# Patient Record
Sex: Female | Born: 1955 | Race: White | Hispanic: No | Marital: Married | Smoking: Never smoker
Health system: Southern US, Community
[De-identification: ages and names within clinical notes are randomized; demographics above are authoritative.]

## PROBLEM LIST (undated history)

## (undated) DIAGNOSIS — H269 Unspecified cataract: Secondary | ICD-10-CM

## (undated) DIAGNOSIS — Z794 Long term (current) use of insulin: Secondary | ICD-10-CM

## (undated) DIAGNOSIS — IMO0001 Reserved for inherently not codable concepts without codable children: Secondary | ICD-10-CM

## (undated) DIAGNOSIS — S52202K Unspecified fracture of shaft of left ulna, subsequent encounter for closed fracture with nonunion: Secondary | ICD-10-CM

## (undated) DIAGNOSIS — I10 Essential (primary) hypertension: Secondary | ICD-10-CM

## (undated) DIAGNOSIS — Z87442 Personal history of urinary calculi: Secondary | ICD-10-CM

## (undated) DIAGNOSIS — Z98811 Dental restoration status: Secondary | ICD-10-CM

## (undated) DIAGNOSIS — E7800 Pure hypercholesterolemia, unspecified: Secondary | ICD-10-CM

## (undated) DIAGNOSIS — E119 Type 2 diabetes mellitus without complications: Secondary | ICD-10-CM

## (undated) DIAGNOSIS — M1990 Unspecified osteoarthritis, unspecified site: Secondary | ICD-10-CM

## (undated) DIAGNOSIS — R112 Nausea with vomiting, unspecified: Secondary | ICD-10-CM

## (undated) DIAGNOSIS — Z9889 Other specified postprocedural states: Secondary | ICD-10-CM

## (undated) HISTORY — PX: CHOLECYSTECTOMY: SHX55

## (undated) HISTORY — PX: LUMBAR LAMINECTOMY: SHX95

## (undated) HISTORY — PX: ABDOMINAL HYSTERECTOMY: SHX81

## (undated) HISTORY — PX: TOTAL KNEE ARTHROPLASTY: SHX125

## (undated) HISTORY — PX: SHOULDER ARTHROSCOPY W/ ROTATOR CUFF REPAIR: SHX2400

## (undated) HISTORY — PX: TONSILLECTOMY: SUR1361

---

## 2012-04-22 ENCOUNTER — Other Ambulatory Visit: Payer: Self-pay | Admitting: Neurosurgery

## 2012-04-28 ENCOUNTER — Encounter (HOSPITAL_COMMUNITY)
Admission: RE | Admit: 2012-04-28 | Discharge: 2012-04-28 | Disposition: A | Discharge status: Home / Self Care | Payer: PRIVATE HEALTH INSURANCE | Source: Ambulatory Visit | Attending: Neurosurgery | Admitting: Neurosurgery

## 2012-04-28 ENCOUNTER — Encounter (HOSPITAL_COMMUNITY): Payer: Self-pay

## 2012-04-28 HISTORY — DX: Unspecified osteoarthritis, unspecified site: M19.90

## 2012-04-28 HISTORY — DX: Pure hypercholesterolemia, unspecified: E78.00

## 2012-04-28 HISTORY — DX: Essential (primary) hypertension: I10

## 2012-04-28 LAB — CBC
Hemoglobin: 14.8 g/dL (ref 12.0–15.0)
MCH: 30.8 pg (ref 26.0–34.0)
MCHC: 34.8 g/dL (ref 30.0–36.0)
RDW: 13.5 % (ref 11.5–15.5)

## 2012-04-28 LAB — BASIC METABOLIC PANEL
BUN: 18 mg/dL (ref 6–23)
Calcium: 10.1 mg/dL (ref 8.4–10.5)
Creatinine, Ser: 0.7 mg/dL (ref 0.50–1.10)
GFR calc non Af Amer: >90 mL/min (ref >90)
Glucose, Bld: 175 mg/dL — ABNORMAL HIGH (ref 70–99)
Potassium: 4.5 mEq/L (ref 3.5–5.1)

## 2012-04-28 MED ORDER — CEFAZOLIN SODIUM-DEXTROSE 2-3 GM-% IV SOLR
2.0000 g | INTRAVENOUS | Status: AC
  Administered 2012-04-29: 2 g via INTRAVENOUS
  Filled 2012-04-28: qty 50

## 2012-04-28 NOTE — H&P (Signed)
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  She has had previous lumbar diskectomy by Dr. Krista Blue which I believe was at this same level and did well from that.  She now says she has excruciating pain over the last five weeks with left leg pain and numbness to her left foot and toes and also weakness of the left foot.  She said the pain has gotten much worse over the last two weeks.  She says Dr. Orvan Falconer referred her to me for treatment.    She says this occurred 1  weeks after knee surgery on 02/05/2012.  The pain began and has increased weekly.  She is to follow with Dr. Chase Picket on 05/03/2012 for her knee.  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  She had right total knee replacement in 10/2006.  She had a laparoscopic cholecystectomy in the past.    REVIEW OF SYSTEMS:   A detailed Review of Systems sheet was reviewed with the patient.  Pertinent positives include: Eyes-wears glasses; Cardiovascular-high blood pressure, and high cholesterol; Musculoskeletal-back pain, joint pain or swelling, arthritis; Endocrine-diabetes.  All other systems are negative; this includes Constitutional symptoms, Ears, nose, mouth, throat, Respiratory, Gastrointestinal, Genitourinary, Integumentary & Breast, Neurologic, Psychiatric, Hematologic/Lymphatic, Allergic/Immunologic.    PAST MEDICAL HISTORY:      Current Medical Conditions:    Additional medical problems include hypertension, noninsulin dependent diabetes, and elevated cholesterol.       Prior Operations and Hospitalizations:   As previously described.      Medications and Allergies:   Mobic 15 mg. q.a.m. A dosepak which helped a little,  Flexeril 10 mg. q.d. which did not give her any relief, Percocet 10/325 up to 3 times daily.  She is also taking Januvia 100 mg. q.a.m., Estradiol 1 mg. q.a.m., Lisinopril 20 mg. q.a.m., Metformin HCL 500 mg. 3 with evening meal, and Atorvastatin 10 mg. q.p.m.        Height and Weight:     She is 5'4" tall, 215 pounds.  She denies tobacco, alcohol or drug use.     DIAGNOSTIC STUDIES:   MRI from [facility] performed 04/08/2012 shows a disc herniation at L4-5 with large left paracentral and left foraminal and left lateral disc protrusion, severe left foraminal narrowing with probable significant neural impingement.  There is low significant material continuing inferior in the left foraminal location raising the question of extruded disc material on the left.  There is mild right foraminal stenosis.  There is moderate to severe central stenosis at this level.  The remaining levels do not appear to be severely affected.    FAMILY HISTORY:    Both parents are deceased with history of diabetes, high blood pressure and heart disease.         PHYSICAL EXAMINATION:      General Appearance:   Phyllis Leonard is a pleasant and cooperative woman in no acute distress.  She is morbidly obese.      Blood Pressure, Pulse:     122/100, heart rate 80, respirations 18.    HEENT - normocephalic, atraumatic.  The pupils are equal, round and reactive to light.  The extraocular  muscles are intact.  Sclerae - white.  Conjunctiva - pink.  Oropharynx benign.  Uvula midline.     Neck - there are no masses, meningismus, deformities, tracheal deviation, jugular vein distention or carotid bruits.  There is normal cervical range of motion.  Spurlings' test is negative without reproducible radicular pain turning the patient's head to either side.  Lhermitte's sign is not present with axial compression.      Respiratory - there is normal respiratory effort with good intercostal function.  Lungs are clear to auscultation.  There are no  rales, rhonchi or wheezes.      Cardiovascular - the heart has regular rate and rhythm to auscultation.  No murmurs are appreciated.  There is no extremity edema, cyanosis or clubbing.  There are palpable pedal pulses.      Abdomen - soft, nontender, no hepatosplenomegaly appreciated or masses.  There are active bowel sounds.  No guarding or rebound.      Musculoskeletal Examination - she has left-sided sciatic notch discomfort radiating to the right lower extremity.  She has severely positive seated straight leg raise on the left and is not able to lie flat because of the severity of her pain.   NEUROLOGICAL EXAMINATION: The patient is oriented to time, person and place and has good recall of both recent and remote memory with normal attention span and concentration.  The patient speaks with clear and fluent speech and exhibits normal language function and appropriate fund of knowledge.      Cranial Nerve Examination - pupils are equal, round and reactive to light.  Extraocular movements are full.  Visual fields are full to confrontational testing.  Facial sensation and facial movement are symmetric and intact.  Hearing is intact to finger rub.  Palate is upgoing.  Shoulder shrug is symmetric.  Tongue protrudes in the midline.      Motor Examination - motor strength is 5/5 in the bilateral deltoids, biceps, triceps, handgrips, wrist extensors, interosseous.  In the lower extremities motor strength is 5/5 in hip flexion, extension, quadriceps, hamstrings, plantar flexion, right dorsiflexion and right extensor hallucis longus.  Left extensor hallucis longus is 4-/5, left dorsiflexor 4+/5, left hip adductor 4/5.    Sensory Examination - normal to light touch and pinprick sensation in the upper and lower extremities.     Deep Tendon Reflexes - 2 in the biceps, triceps, and brachioradialis, 2 in the knees, 2 in the ankles.  The great toes are downgoing to plantar stimulation.  No pathologic reflexes.         Cerebellar Examination - normal coordination in upper and lower extremities and normal rapid alternating movements.  Romberg test is negative.    IMPRESSION AND RECOMMENDATIONS: Floyce Wice is a 56 year old woman  with severe left leg pain and weakness in the left leg with a large disc herniation.  This appears to be a recurrent disc herniation. I have recommended she undergo left L4-5 re-do microdiskectomy and this will be performed on 04/29/2012.  Risks and benefits are discussed with the patient and she wishes to proceed.  I reviewed the studies with the patient and went over her physical examination.  I reviewed surgical models and discussed the typical hospital course and operative and postoperative course and the potential risks and benefits of surgery.  The risks of surgery were discussed in detail and include, but are not limited to, the risks of anesthesia, blood loss and the possibility of hemorrhage, infection, damage to nerves, damage to  blood vessels, injury to the lumbar nerve root causing either temporary or permanent leg pain, numbness, weakness.  There is potential for spinal fluid leak from dural tear.  There is the potential for post-laminectomy spondylolisthesis, recurrent disc ruptured quoted at approximately 10%, failure to relieve pain, worsening of pain, need for further surgery.    I gave her a prescription for Percocet 10/325 one every four hours as needed #60.  She will plan on being out of work approximately 8 weeks following surgery.      [facility]    Danae Orleans. Venetia Maxon, M.D.  JDS:gde  cc: Dr. Andi Hence

## 2012-04-28 NOTE — Pre-Procedure Instructions (Signed)
[name]  04/28/2012   Your procedure is scheduled on:  Thursday October 17  Report to [facility] at 9:30 AM.  Call this number if you have problems the morning of surgery: [phone]   Remember:   Do not eat or drink: After Midnight.    Take these medicines the morning of surgery with A SIP OF WATER: none   Do not wear jewelry, make-up or nail polish.  Do not wear lotions, powders, or perfumes. You may wear deodorant.  Do not shave 48 hours prior to surgery. Men may shave face and neck.  Do not bring valuables to the hospital.  Contacts, dentures or bridgework may not be worn into surgery.  Leave suitcase in the car. After surgery it may be brought to your room.  For patients admitted to the hospital, checkout time is 11:00 AM the day of discharge.   Patients discharged the day of surgery will not be allowed to drive home.  Name and phone number of your driver: NA  Special Instructions: Shower with [facility] was tonight and tomorrow morning.   Please read over the following fact sheets that you were given: Pain Booklet, Coughing and Deep Breathing and Surgical Site Infection Prevention

## 2012-04-28 NOTE — Progress Notes (Signed)
Requested and received CXR from [facility].   Requested EKG from Dr. Oscar La in [place], however not available.

## 2012-04-29 ENCOUNTER — Encounter (HOSPITAL_COMMUNITY): Payer: Self-pay | Admitting: *Deleted

## 2012-04-29 ENCOUNTER — Observation Stay (HOSPITAL_COMMUNITY)
Admission: RE | Admit: 2012-04-29 | Discharge: 2012-04-30 | Disposition: A | Discharge status: Home / Self Care | Payer: PRIVATE HEALTH INSURANCE | Source: Ambulatory Visit | Attending: Neurosurgery | Admitting: Neurosurgery

## 2012-04-29 ENCOUNTER — Ambulatory Visit (HOSPITAL_COMMUNITY): Payer: PRIVATE HEALTH INSURANCE | Admitting: Anesthesiology

## 2012-04-29 ENCOUNTER — Encounter (HOSPITAL_COMMUNITY): Payer: Self-pay | Admitting: Anesthesiology

## 2012-04-29 ENCOUNTER — Observation Stay (HOSPITAL_COMMUNITY): Payer: PRIVATE HEALTH INSURANCE

## 2012-04-29 ENCOUNTER — Encounter (HOSPITAL_COMMUNITY)
Admission: RE | Disposition: A | Discharge status: Home / Self Care | Payer: Self-pay | Source: Ambulatory Visit | Attending: Neurosurgery

## 2012-04-29 DIAGNOSIS — Z01810 Encounter for preprocedural cardiovascular examination: Secondary | ICD-10-CM | POA: Insufficient documentation

## 2012-04-29 DIAGNOSIS — M51379 Other intervertebral disc degeneration, lumbosacral region without mention of lumbar back pain or lower extremity pain: Secondary | ICD-10-CM | POA: Insufficient documentation

## 2012-04-29 DIAGNOSIS — Z01812 Encounter for preprocedural laboratory examination: Secondary | ICD-10-CM | POA: Insufficient documentation

## 2012-04-29 DIAGNOSIS — I10 Essential (primary) hypertension: Secondary | ICD-10-CM | POA: Insufficient documentation

## 2012-04-29 DIAGNOSIS — M5137 Other intervertebral disc degeneration, lumbosacral region: Secondary | ICD-10-CM | POA: Insufficient documentation

## 2012-04-29 DIAGNOSIS — E119 Type 2 diabetes mellitus without complications: Secondary | ICD-10-CM | POA: Insufficient documentation

## 2012-04-29 DIAGNOSIS — Z01818 Encounter for other preprocedural examination: Secondary | ICD-10-CM | POA: Insufficient documentation

## 2012-04-29 DIAGNOSIS — M5126 Other intervertebral disc displacement, lumbar region: Principal | ICD-10-CM | POA: Insufficient documentation

## 2012-04-29 HISTORY — PX: LUMBAR LAMINECTOMY/DECOMPRESSION MICRODISCECTOMY: SHX5026

## 2012-04-29 LAB — GLUCOSE, CAPILLARY

## 2012-04-29 SURGERY — LUMBAR LAMINECTOMY/DECOMPRESSION MICRODISCECTOMY 1 LEVEL
Anesthesia: General | Site: Back | Laterality: Left | Wound class: Clean

## 2012-04-29 MED ORDER — MENTHOL 3 MG MT LOZG
1.0000 | LOZENGE | OROMUCOSAL | Status: DC | PRN
  Administered 2012-04-29: 3 mg via ORAL
  Filled 2012-04-29: qty 9

## 2012-04-29 MED ORDER — DEXAMETHASONE SODIUM PHOSPHATE 4 MG/ML IJ SOLN
INTRAMUSCULAR | Status: DC | PRN
  Administered 2012-04-29: 8 mg via INTRAVENOUS

## 2012-04-29 MED ORDER — ATORVASTATIN CALCIUM 10 MG PO TABS
10.0000 mg | ORAL_TABLET | Freq: Every day | ORAL | Status: DC
  Administered 2012-04-29 – 2012-04-30 (×2): 10 mg via ORAL
  Filled 2012-04-29 (×2): qty 1

## 2012-04-29 MED ORDER — DOCUSATE SODIUM 100 MG PO CAPS
100.0000 mg | ORAL_CAPSULE | Freq: Two times a day (BID) | ORAL | Status: DC
  Administered 2012-04-29: 100 mg via ORAL
  Filled 2012-04-29: qty 1

## 2012-04-29 MED ORDER — LINAGLIPTIN 5 MG PO TABS
5.0000 mg | ORAL_TABLET | Freq: Every day | ORAL | Status: DC
  Administered 2012-04-29 – 2012-04-30 (×2): 5 mg via ORAL
  Filled 2012-04-29 (×2): qty 1

## 2012-04-29 MED ORDER — ONDANSETRON HCL 4 MG/2ML IJ SOLN
INTRAMUSCULAR | Status: DC | PRN
  Administered 2012-04-29: 4 mg via INTRAVENOUS

## 2012-04-29 MED ORDER — MELOXICAM 15 MG PO TABS
15.0000 mg | ORAL_TABLET | Freq: Every day | ORAL | Status: DC
  Administered 2012-04-29 – 2012-04-30 (×2): 15 mg via ORAL
  Filled 2012-04-29 (×2): qty 1

## 2012-04-29 MED ORDER — LISINOPRIL 20 MG PO TABS
20.0000 mg | ORAL_TABLET | Freq: Every day | ORAL | Status: DC
  Administered 2012-04-29 – 2012-04-30 (×2): 20 mg via ORAL
  Filled 2012-04-29 (×2): qty 1

## 2012-04-29 MED ORDER — SODIUM CHLORIDE 0.9 % IJ SOLN
3.0000 mL | Freq: Two times a day (BID) | INTRAMUSCULAR | Status: DC
  Administered 2012-04-29: 3 mL via INTRAVENOUS

## 2012-04-29 MED ORDER — DIAZEPAM 5 MG PO TABS
5.0000 mg | ORAL_TABLET | Freq: Four times a day (QID) | ORAL | Status: DC | PRN
  Administered 2012-04-29: 5 mg via ORAL
  Filled 2012-04-29: qty 1

## 2012-04-29 MED ORDER — METHYLPREDNISOLONE ACETATE 80 MG/ML IJ SUSP
INTRAMUSCULAR | Status: DC | PRN
  Administered 2012-04-29: 80 mg

## 2012-04-29 MED ORDER — METOCLOPRAMIDE HCL 5 MG/ML IJ SOLN: 10.0000 mg | Freq: Once | INTRAMUSCULAR | Status: DC | PRN

## 2012-04-29 MED ORDER — 0.9 % SODIUM CHLORIDE (POUR BTL) OPTIME
TOPICAL | Status: DC | PRN
  Administered 2012-04-29: 1000 mL

## 2012-04-29 MED ORDER — PROPOFOL 10 MG/ML IV BOLUS
INTRAVENOUS | Status: DC | PRN
  Administered 2012-04-29: 200 mg via INTRAVENOUS

## 2012-04-29 MED ORDER — ACETAMINOPHEN 325 MG PO TABS: 650.0000 mg | ORAL_TABLET | ORAL | Status: DC | PRN

## 2012-04-29 MED ORDER — ONDANSETRON HCL 4 MG/2ML IJ SOLN
4.0000 mg | INTRAMUSCULAR | Status: DC | PRN
  Administered 2012-04-29: 4 mg via INTRAVENOUS
  Filled 2012-04-29: qty 2

## 2012-04-29 MED ORDER — HYDROMORPHONE HCL PF 1 MG/ML IJ SOLN
INTRAMUSCULAR | Status: AC
  Filled 2012-04-29: qty 1

## 2012-04-29 MED ORDER — SENNA 8.6 MG PO TABS
1.0000 | ORAL_TABLET | Freq: Two times a day (BID) | ORAL | Status: DC
  Administered 2012-04-29 – 2012-04-30 (×2): 8.6 mg via ORAL
  Filled 2012-04-29 (×3): qty 1

## 2012-04-29 MED ORDER — PANTOPRAZOLE SODIUM 40 MG IV SOLR
40.0000 mg | Freq: Every day | INTRAVENOUS | Status: DC
  Administered 2012-04-29: 40 mg via INTRAVENOUS
  Filled 2012-04-29 (×2): qty 40

## 2012-04-29 MED ORDER — FENTANYL CITRATE 0.05 MG/ML IJ SOLN
INTRAMUSCULAR | Status: DC | PRN
  Administered 2012-04-29: 100 ug via INTRAVENOUS

## 2012-04-29 MED ORDER — HYDROMORPHONE HCL PF 1 MG/ML IJ SOLN
INTRAMUSCULAR | Status: DC | PRN
  Administered 2012-04-29: 1 mg via INTRAVENOUS

## 2012-04-29 MED ORDER — ZOLPIDEM TARTRATE 5 MG PO TABS: 5.0000 mg | ORAL_TABLET | Freq: Every evening | ORAL | Status: DC | PRN

## 2012-04-29 MED ORDER — CEFAZOLIN SODIUM 1-5 GM-% IV SOLN
1.0000 g | Freq: Three times a day (TID) | INTRAVENOUS | Status: AC
  Administered 2012-04-29 – 2012-04-30 (×2): 1 g via INTRAVENOUS
  Filled 2012-04-29 (×2): qty 50

## 2012-04-29 MED ORDER — BUPIVACAINE HCL (PF) 0.5 % IJ SOLN
INTRAMUSCULAR | Status: DC | PRN
  Administered 2012-04-29: 5 mL

## 2012-04-29 MED ORDER — SODIUM CHLORIDE 0.9 % IR SOLN
Status: DC | PRN
  Administered 2012-04-29: 12:00:00

## 2012-04-29 MED ORDER — OXYCODONE HCL 5 MG PO TABS
ORAL_TABLET | ORAL | Status: AC
  Filled 2012-04-29: qty 1

## 2012-04-29 MED ORDER — ARTIFICIAL TEARS OP OINT
TOPICAL_OINTMENT | OPHTHALMIC | Status: DC | PRN
  Administered 2012-04-29: 1 via OPHTHALMIC

## 2012-04-29 MED ORDER — OXYCODONE-ACETAMINOPHEN 10-325 MG PO TABS: 1.0000 | ORAL_TABLET | ORAL | Status: DC | PRN

## 2012-04-29 MED ORDER — ACETAMINOPHEN 650 MG RE SUPP: 650.0000 mg | RECTAL | Status: DC | PRN

## 2012-04-29 MED ORDER — OXYCODONE-ACETAMINOPHEN 5-325 MG PO TABS: 1.0000 | ORAL_TABLET | ORAL | Status: DC | PRN

## 2012-04-29 MED ORDER — METFORMIN HCL ER 750 MG PO TB24: 1500.0000 mg | ORAL_TABLET | Freq: Every day | ORAL | Status: DC

## 2012-04-29 MED ORDER — KCL IN DEXTROSE-NACL 20-5-0.45 MEQ/L-%-% IV SOLN
INTRAVENOUS | Status: DC
  Filled 2012-04-29 (×3): qty 1000

## 2012-04-29 MED ORDER — HYDROCODONE-ACETAMINOPHEN 5-325 MG PO TABS: 1.0000 | ORAL_TABLET | ORAL | Status: DC | PRN

## 2012-04-29 MED ORDER — PHENOL 1.4 % MT LIQD: 1.0000 | OROMUCOSAL | Status: DC | PRN

## 2012-04-29 MED ORDER — ROCURONIUM BROMIDE 100 MG/10ML IV SOLN
INTRAVENOUS | Status: DC | PRN
  Administered 2012-04-29: 50 mg via INTRAVENOUS

## 2012-04-29 MED ORDER — OXYCODONE HCL 5 MG PO TABS: 5.0000 mg | ORAL_TABLET | ORAL | Status: DC | PRN

## 2012-04-29 MED ORDER — MUPIROCIN 2 % EX OINT
1.0000 "application " | TOPICAL_OINTMENT | Freq: Two times a day (BID) | CUTANEOUS | Status: DC
  Administered 2012-04-30: 1 via NASAL
  Filled 2012-04-29: qty 22

## 2012-04-29 MED ORDER — MUPIROCIN 2 % EX OINT: 1.0000 "application " | TOPICAL_OINTMENT | Freq: Two times a day (BID) | CUTANEOUS | Status: DC

## 2012-04-29 MED ORDER — OXYCODONE HCL 5 MG/5ML PO SOLN: 5.0000 mg | Freq: Once | ORAL | Status: AC | PRN

## 2012-04-29 MED ORDER — MIDAZOLAM HCL 5 MG/5ML IJ SOLN
INTRAMUSCULAR | Status: DC | PRN
  Administered 2012-04-29: 2 mg via INTRAVENOUS

## 2012-04-29 MED ORDER — LACTATED RINGERS IV SOLN
INTRAVENOUS | Status: DC | PRN
  Administered 2012-04-29 (×2): via INTRAVENOUS

## 2012-04-29 MED ORDER — ESTRADIOL 1 MG PO TABS
1.0000 mg | ORAL_TABLET | Freq: Every day | ORAL | Status: DC
  Administered 2012-04-29 – 2012-04-30 (×2): 1 mg via ORAL
  Filled 2012-04-29 (×2): qty 1

## 2012-04-29 MED ORDER — PHENYLEPHRINE HCL 10 MG/ML IJ SOLN
INTRAMUSCULAR | Status: DC | PRN
  Administered 2012-04-29 (×3): 40 ug via INTRAVENOUS

## 2012-04-29 MED ORDER — LIDOCAINE HCL (CARDIAC) 20 MG/ML IV SOLN
INTRAVENOUS | Status: DC | PRN
  Administered 2012-04-29: 100 mg via INTRAVENOUS

## 2012-04-29 MED ORDER — MORPHINE SULFATE 2 MG/ML IJ SOLN: 1.0000 mg | INTRAMUSCULAR | Status: DC | PRN

## 2012-04-29 MED ORDER — GLYCOPYRROLATE 0.2 MG/ML IJ SOLN
INTRAMUSCULAR | Status: DC | PRN
  Administered 2012-04-29: .8 mg via INTRAVENOUS

## 2012-04-29 MED ORDER — HYDROMORPHONE HCL PF 1 MG/ML IJ SOLN
0.2500 mg | INTRAMUSCULAR | Status: DC | PRN
  Administered 2012-04-29 (×4): 0.5 mg via INTRAVENOUS

## 2012-04-29 MED ORDER — THROMBIN 20000 UNITS EX SOLR
CUTANEOUS | Status: DC | PRN
  Administered 2012-04-29: 12:00:00 via TOPICAL

## 2012-04-29 MED ORDER — CYCLOBENZAPRINE HCL 10 MG PO TABS: 10.0000 mg | ORAL_TABLET | Freq: Three times a day (TID) | ORAL | Status: DC | PRN

## 2012-04-29 MED ORDER — FENTANYL CITRATE 0.05 MG/ML IJ SOLN
INTRAMUSCULAR | Status: AC
  Filled 2012-04-29: qty 2

## 2012-04-29 MED ORDER — METFORMIN HCL ER 750 MG PO TB24
1500.0000 mg | ORAL_TABLET | Freq: Every day | ORAL | Status: DC
  Administered 2012-04-29: 1500 mg via ORAL
  Filled 2012-04-29 (×2): qty 2

## 2012-04-29 MED ORDER — OXYCODONE HCL 5 MG PO TABS
5.0000 mg | ORAL_TABLET | Freq: Once | ORAL | Status: AC | PRN
Start: 2012-04-29 — End: 2012-04-29
  Administered 2012-04-29: 5 mg via ORAL

## 2012-04-29 MED ORDER — NEOSTIGMINE METHYLSULFATE 1 MG/ML IJ SOLN
INTRAMUSCULAR | Status: DC | PRN
  Administered 2012-04-29: 5 mg via INTRAVENOUS

## 2012-04-29 MED ORDER — BACITRACIN 50000 UNITS IM SOLR
INTRAMUSCULAR | Status: AC
  Filled 2012-04-29: qty 1

## 2012-04-29 MED ORDER — SODIUM CHLORIDE 0.9 % IJ SOLN: 3.0000 mL | INTRAMUSCULAR | Status: DC | PRN

## 2012-04-29 MED ORDER — SODIUM CHLORIDE 0.9 % IV SOLN
INTRAVENOUS | Status: AC
  Filled 2012-04-29: qty 500

## 2012-04-29 MED ORDER — LIDOCAINE-EPINEPHRINE 1 %-1:100000 IJ SOLN
INTRAMUSCULAR | Status: DC | PRN
  Administered 2012-04-29: 5 mL

## 2012-04-29 MED ORDER — FENTANYL CITRATE 0.05 MG/ML IJ SOLN
INTRAMUSCULAR | Status: DC | PRN
  Administered 2012-04-29 (×5): 50 ug via INTRAVENOUS
  Administered 2012-04-29: 100 ug via INTRAVENOUS
  Administered 2012-04-29: 50 ug via INTRAVENOUS

## 2012-04-29 SURGICAL SUPPLY — 55 items
BAG DECANTER FOR FLEXI CONT (MISCELLANEOUS) ×2 IMPLANT
BENZOIN TINCTURE PRP APPL 2/3 (GAUZE/BANDAGES/DRESSINGS) IMPLANT
BIT DRILL NEURO 2X3.1 SFT TUCH (MISCELLANEOUS) ×1 IMPLANT
BLADE SURG ROTATE 9660 (MISCELLANEOUS) IMPLANT
BUR ROUND FLUTED 5 RND (BURR) ×2 IMPLANT
CANISTER SUCTION 2500CC (MISCELLANEOUS) ×2 IMPLANT
CLOTH BEACON ORANGE TIMEOUT ST (SAFETY) ×2 IMPLANT
CONT SPEC 4OZ CLIKSEAL STRL BL (MISCELLANEOUS) ×2 IMPLANT
DERMABOND ADVANCED (GAUZE/BANDAGES/DRESSINGS) ×1
DERMABOND ADVANCED .7 DNX12 (GAUZE/BANDAGES/DRESSINGS) ×1 IMPLANT
DRAPE LAPAROTOMY 100X72X124 (DRAPES) ×2 IMPLANT
DRAPE MICROSCOPE LEICA (MISCELLANEOUS) ×2 IMPLANT
DRAPE POUCH INSTRU U-SHP 10X18 (DRAPES) ×2 IMPLANT
DRAPE SURG 17X23 STRL (DRAPES) ×2 IMPLANT
DRESSING TELFA 8X3 (GAUZE/BANDAGES/DRESSINGS) IMPLANT
DRILL NEURO 2X3.1 SOFT TOUCH (MISCELLANEOUS) ×2
DURAPREP 26ML APPLICATOR (WOUND CARE) ×2 IMPLANT
ELECT REM PT RETURN 9FT ADLT (ELECTROSURGICAL) ×2
ELECTRODE REM PT RTRN 9FT ADLT (ELECTROSURGICAL) ×1 IMPLANT
GAUZE SPONGE 4X4 16PLY XRAY LF (GAUZE/BANDAGES/DRESSINGS) IMPLANT
GLOVE BIO SURGEON STRL SZ8 (GLOVE) ×2 IMPLANT
GLOVE BIOGEL PI IND STRL 8 (GLOVE) ×1 IMPLANT
GLOVE BIOGEL PI IND STRL 8.5 (GLOVE) ×2 IMPLANT
GLOVE BIOGEL PI INDICATOR 8 (GLOVE) ×1
GLOVE BIOGEL PI INDICATOR 8.5 (GLOVE) ×2
GLOVE ECLIPSE 7.5 STRL STRAW (GLOVE) ×2 IMPLANT
GLOVE ECLIPSE 8.0 STRL XLNG CF (GLOVE) ×2 IMPLANT
GLOVE EXAM NITRILE LRG STRL (GLOVE) IMPLANT
GLOVE EXAM NITRILE MD LF STRL (GLOVE) IMPLANT
GLOVE EXAM NITRILE XL STR (GLOVE) IMPLANT
GLOVE EXAM NITRILE XS STR PU (GLOVE) IMPLANT
GLOVE SURG SS PI 8.0 STRL IVOR (GLOVE) ×4 IMPLANT
GOWN BRE IMP SLV AUR LG STRL (GOWN DISPOSABLE) IMPLANT
GOWN BRE IMP SLV AUR XL STRL (GOWN DISPOSABLE) ×6 IMPLANT
GOWN STRL REIN 2XL LVL4 (GOWN DISPOSABLE) ×2 IMPLANT
KIT BASIN OR (CUSTOM PROCEDURE TRAY) ×2 IMPLANT
KIT ROOM TURNOVER OR (KITS) ×2 IMPLANT
NEEDLE HYPO 18GX1.5 BLUNT FILL (NEEDLE) ×2 IMPLANT
NEEDLE HYPO 25X1 1.5 SAFETY (NEEDLE) ×2 IMPLANT
NS IRRIG 1000ML POUR BTL (IV SOLUTION) ×2 IMPLANT
PACK LAMINECTOMY NEURO (CUSTOM PROCEDURE TRAY) ×2 IMPLANT
PAD ARMBOARD 7.5X6 YLW CONV (MISCELLANEOUS) ×6 IMPLANT
RUBBERBAND STERILE (MISCELLANEOUS) ×4 IMPLANT
SPONGE GAUZE 4X4 12PLY (GAUZE/BANDAGES/DRESSINGS) IMPLANT
SPONGE SURGIFOAM ABS GEL SZ50 (HEMOSTASIS) ×2 IMPLANT
STRIP CLOSURE SKIN 1/2X4 (GAUZE/BANDAGES/DRESSINGS) IMPLANT
SUT VIC AB 0 CT1 18XCR BRD8 (SUTURE) ×1 IMPLANT
SUT VIC AB 0 CT1 8-18 (SUTURE) ×1
SUT VIC AB 2-0 CT1 18 (SUTURE) ×2 IMPLANT
SUT VIC AB 3-0 SH 8-18 (SUTURE) ×2 IMPLANT
SYR 20ML ECCENTRIC (SYRINGE) ×2 IMPLANT
SYR 5ML LL (SYRINGE) ×2 IMPLANT
TOWEL OR 17X24 6PK STRL BLUE (TOWEL DISPOSABLE) ×2 IMPLANT
TOWEL OR 17X26 10 PK STRL BLUE (TOWEL DISPOSABLE) ×2 IMPLANT
WATER STERILE IRR 1000ML POUR (IV SOLUTION) ×2 IMPLANT

## 2012-04-29 NOTE — Brief Op Note (Signed)
04/29/2012  4:07 PM  PATIENT:  Phyllis Leonard  56 y.o. 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, Lumbar spondylosis, Lumbar degenerative disc disease, lumbar radiculopathy L4/5 Left  PROCEDURE:  Procedure(s) (LRB) with comments: LUMBAR LAMINECTOMY/DECOMPRESSION MICRODISCECTOMY 1 LEVEL (Left) - Left Lumbar Four-Five Redo Microdiskectomy with microdissection  SURGEON:  Surgeon(s) and Role:    * Maeola Harman, MD - Primary    * Karn Cassis, MD - Assisting  PHYSICIAN ASSISTANT:   ASSISTANTS: Poteat, RN   ANESTHESIA:   general  EBL:  Total I/O In: 1500 [I.V.:1500] Out: -   BLOOD ADMINISTERED:none  DRAINS: none   LOCAL MEDICATIONS USED:  LIDOCAINE   SPECIMEN:  No Specimen  DISPOSITION OF SPECIMEN:  N/A  COUNTS:  YES  TOURNIQUET:  * No tourniquets in log *  DICTATION:  DICTATION: Patient has a recurrent left L4-5 disc rupture with significant left leg weakness. It was elected to take her to surgery for redo left L4-5 microdiscectomy on an urgent basis.  Procedure: Patient was brought to the operating room and following the smooth and uncomplicated induction of general endotracheal anesthesia she was placed in a prone position on the Wilson frame. Low back was prepped and draped in the usual sterile fashion with DuraPrep. Area of planned incision was infiltrated with local lidocaine. Incision was made in the midline through her previous incision and carried to the lumbodorsal fascia which was incised on the left side of midline. Subperiosteal dissection was performed exposing what was felt to be L45 level. Intraoperative x-ray confirmed correct orientation . Exposure was carried to expose the L4-5 level and site of prior laminectomy. The previous  bony defect was defined and bone removal was extended in each direction with the high speed drill and completed with Kerrison rongeurs and a generous foraminotomy was performed overlying the superior aspect of the L5 lamina. Scar tissue was detached and removed in a piecemeal fashion and under the microscope, the left L 5 nerve root was mobilized medially, exposing multiple free fragments of herniated disc material. There was a large amount of additional disc material which I removed, which did not extend into the interspace.  I was able at this point to mobilize the nerve medially and to palpate along its course with ball hooks and confirm that there were no additional compressive disc fragments. While there was a spondylitic ridge of disc remaining, I elected not to incise the interspace. The interspace was then irrigated with bacitracin saline and no additional disc material was mobilized. Hemostasis was assured with bipolar electrocautery and the interspace was irrigated with Depo-Medrol and fentanyl. The lumbodorsal fascia was closed with 0 Vicryl sutures the subcutaneous tissues reapproximated 2-0 Vicryl inverted sutures and the skin edges were reapproximated with 3-0 Vicryl subcuticular stitch. The wound is dressed with Dermabond. Patient was extubated in the operating room and taken to recovery in stable and satisfactory condition having tolerated her operation well. Counts were correct at the end of the case.  PLAN OF CARE: Admit for overnight observation  PATIENT DISPOSITION:  PACU - hemodynamically stable.   Delay start of Pharmacological VTE agent (>24hrs) due to surgical blood loss or risk of bleeding: yes

## 2012-04-29 NOTE — Preoperative (Signed)
Beta Blockers   Reason not to administer Beta Blockers:Not Applicable 

## 2012-04-29 NOTE — Op Note (Signed)
04/29/2012  4:07 PM  PATIENT:  Phyllis Leonard  56 y.o. 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, Lumbar spondylosis, Lumbar degenerative disc disease, lumbar radiculopathy L4/5 Left  PROCEDURE:  Procedure(s) (LRB) with comments: LUMBAR LAMINECTOMY/DECOMPRESSION MICRODISCECTOMY 1 LEVEL (Left) - Left Lumbar Four-Five Redo Microdiskectomy with microdissection  SURGEON:  Surgeon(s) and Role:    * Chanin Frumkin, MD - Primary    * Ernesto M Botero, MD - Assisting  PHYSICIAN ASSISTANT:   ASSISTANTS: Poteat, RN   ANESTHESIA:   general  EBL:  Total I/O In: 1500 [I.V.:1500] Out: -   BLOOD ADMINISTERED:none  DRAINS: none   LOCAL MEDICATIONS USED:  LIDOCAINE   SPECIMEN:  No Specimen  DISPOSITION OF SPECIMEN:  N/A  COUNTS:  YES  TOURNIQUET:  * No tourniquets in log *  DICTATION:  DICTATION: Patient has a recurrent left L4-5 disc rupture with significant left leg weakness. It was elected to take her to surgery for redo left L4-5 microdiscectomy on an urgent basis.  Procedure: Patient was brought to the operating room and following the smooth and uncomplicated induction of general endotracheal anesthesia she was placed in a prone position on the Wilson frame. Low back was prepped and draped in the usual sterile fashion with DuraPrep. Area of planned incision was infiltrated with local lidocaine. Incision was made in the midline through her previous incision and carried to the lumbodorsal fascia which was incised on the left side of midline. Subperiosteal dissection was performed exposing what was felt to be L45 level. Intraoperative x-ray confirmed correct orientation . Exposure was carried to expose the L4-5 level and site of prior laminectomy. The previous  bony defect was defined and bone removal was extended in each direction with the high speed drill and completed with Kerrison rongeurs and a generous foraminotomy was performed overlying the superior aspect of the L5 lamina. Scar tissue was detached and removed in a piecemeal fashion and under the microscope, the left L 5 nerve root was mobilized medially, exposing multiple free fragments of herniated disc material. There was a large amount of additional disc material which I removed, which did not extend into the interspace.  I was able at this point to mobilize the nerve medially and to palpate along its course with ball hooks and confirm that there were no additional compressive disc fragments. While there was a spondylitic ridge of disc remaining, I elected not to incise the interspace. The interspace was then irrigated with bacitracin saline and no additional disc material was mobilized. Hemostasis was assured with bipolar electrocautery and the interspace was irrigated with Depo-Medrol and fentanyl. The lumbodorsal fascia was closed with 0 Vicryl sutures the subcutaneous tissues reapproximated 2-0 Vicryl inverted sutures and the skin edges were reapproximated with 3-0 Vicryl subcuticular stitch. The wound is dressed with Dermabond. Patient was extubated in the operating room and taken to recovery in stable and satisfactory condition having tolerated her operation well. Counts were correct at the end of the case.  PLAN OF CARE: Admit for overnight observation  PATIENT DISPOSITION:  PACU - hemodynamically stable.   Delay start of Pharmacological VTE agent (>24hrs) due to surgical blood loss or risk of bleeding: yes  

## 2012-04-29 NOTE — Progress Notes (Signed)
Doing well 

## 2012-04-29 NOTE — Progress Notes (Signed)
Patient ID: Phyllis Leonard, female   DOB: 1955-07-25, 56 y.o.   MRN: [id number]  Awakens to touch, drowsy as expected post-op. MAEW, excellent strength BLE. Pain controlled - only c/o lumbar pain - no leg pain.  Transferring to 3500 for overnight observation.  Georgiann Cocker, RN, BSN

## 2012-04-29 NOTE — Anesthesia Postprocedure Evaluation (Signed)
Anesthesia Post Note  Patient: Phyllis Leonard  Procedure(s) Performed: Procedure(s) (LRB): LUMBAR LAMINECTOMY/DECOMPRESSION MICRODISCECTOMY 1 LEVEL (Left)  Anesthesia type: general  Patient location: PACU  Post pain: Pain level controlled  Post assessment: Patient's Cardiovascular Status Stable  Last Vitals:  Filed Vitals:   04/29/12 1307  BP: 155/77  Pulse: 76  Temp: 36.3 C  Resp: 18    Post vital signs: Reviewed and stable  Level of consciousness: sedated  Complications: No apparent anesthesia complications

## 2012-04-29 NOTE — Plan of Care (Signed)
Problem: Consults Goal: Diagnosis - Spinal Surgery Outcome: Completed/Met Date Met:  04/29/12 Microdiscectomy     

## 2012-04-29 NOTE — Transfer of Care (Signed)
Immediate Anesthesia Transfer of Care Note  Patient: Phyllis Leonard  Procedure(s) Performed: Procedure(s) (LRB) with comments: LUMBAR LAMINECTOMY/DECOMPRESSION MICRODISCECTOMY 1 LEVEL (Left) - Left Lumbar Four-Five Redo Microdiskectomy  Patient Location: PACU  Anesthesia Type: General  Level of Consciousness: awake, alert  and oriented  Airway & Oxygen Therapy: Patient Spontanous Breathing  Post-op Assessment: Report given to PACU RN  Post vital signs: stable  Complications: No apparent anesthesia complications

## 2012-04-29 NOTE — Anesthesia Procedure Notes (Signed)
Procedure Name: Intubation Date/Time: 04/29/2012 11:50 AM Performed by: Ellin Goodie Pre-anesthesia Checklist: Patient identified, Emergency Drugs available, Suction available, Patient being monitored and Timeout performed Patient Re-evaluated:Patient Re-evaluated prior to inductionOxygen Delivery Method: Circle system utilized Preoxygenation: Pre-oxygenation with 100% oxygen Intubation Type: IV induction Ventilation: Mask ventilation without difficulty Laryngoscope Size: Mac and 4 Grade View: Grade I Tube type: Oral Tube size: 7.5 mm Number of attempts: 1 Airway Equipment and Method: Stylet Placement Confirmation: ETT inserted through vocal cords under direct vision,  positive ETCO2 and breath sounds checked- equal and bilateral Secured at: 22 cm Tube secured with: Tape Dental Injury: Teeth and Oropharynx as per pre-operative assessment  Comments: Intubation by Ernie Avena, CRNA

## 2012-04-29 NOTE — Anesthesia Preprocedure Evaluation (Addendum)
Anesthesia Evaluation  Patient identified by MRN, date of birth, ID band Patient awake    Reviewed: Allergy & Precautions, H&P , NPO status , Patient's Chart, lab work & pertinent test results, reviewed documented beta blocker date and time   Airway Mallampati: II TM Distance: >3 FB Neck ROM: full    Dental  (+) Dental Advisory Given   Pulmonary neg pulmonary ROS,  breath sounds clear to auscultation        Cardiovascular hypertension, On Medications and Pt. on medications Rhythm:regular     Neuro/Psych negative neurological ROS  negative psych ROS   GI/Hepatic negative GI ROS, Neg liver ROS,   Endo/Other  diabetes, Oral Hypoglycemic AgentsMorbid obesity  Renal/GU negative Renal ROS  negative genitourinary   Musculoskeletal   Abdominal   Peds  Hematology negative hematology ROS (+)   Anesthesia Other Findings See surgeon's H&P   Reproductive/Obstetrics negative OB ROS                          Anesthesia Physical Anesthesia Plan  ASA: III  Anesthesia Plan: General   Post-op Pain Management:    Induction: Intravenous  Airway Management Planned: Oral ETT  Additional Equipment:   Intra-op Plan:   Post-operative Plan: Extubation in OR  Informed Consent: I have reviewed the patients History and Physical, chart, labs and discussed the procedure including the risks, benefits and alternatives for the proposed anesthesia with the patient or authorized representative who has indicated his/her understanding and acceptance.   Dental Advisory Given  Plan Discussed with: CRNA, Surgeon and Anesthesiologist  Anesthesia Plan Comments:        Anesthesia Quick Evaluation

## 2012-04-29 NOTE — Interval H&P Note (Signed)
History and Physical Interval Note:  04/29/2012 4:39 AM  Phyllis Leonard  has presented today for surgery, with the diagnosis of Lumbar hnp without myelopathy, Lumbar spondylosis, Lumbar degenerative disc disease, lumbar radiculopathy  The various methods of treatment have been discussed with the patient and family. After consideration of risks, benefits and other options for treatment, the patient has consented to  Procedure(s) (LRB) with comments: LUMBAR LAMINECTOMY/DECOMPRESSION MICRODISCECTOMY 1 LEVEL (Left) - Left L4-5 Redo Microdiskectomy as a surgical intervention .  The patient's history has been reviewed, patient examined, no change in status, stable for surgery.  I have reviewed the patient's chart and labs.  Questions were answered to the patient's satisfaction.     [name] D  Date of Initial H&P: 04/28/2012  History reviewed, patient examined, no change in status, stable for surgery.

## 2012-04-30 ENCOUNTER — Encounter (HOSPITAL_COMMUNITY): Payer: Self-pay | Admitting: Neurosurgery

## 2012-04-30 LAB — GLUCOSE, CAPILLARY

## 2012-04-30 NOTE — Discharge Summary (Signed)
Physician Discharge Summary  Patient ID: [name] MRN: [id number] DOB/AGE: 08/07/1955 57 y.o.  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 *    Discharged Condition: good  Hospital Course: Phyllis Leonard was admitted 04-29-12 for redo left L4-5 microdiscectomy for recurrent HNP. Following uncomplicated surgery, she recovered nicely and transferred to 3500 for overnight observation. One episode nausea and vomiting resolved. Now reporting no leg pain and decreased numbness left foot.  Consults: None  Significant Diagnostic Studies: radiology: X-Ray: intra-operative  Treatments: surgery: Left Lumbar Four-Five Redo Microdiskectomy with microdissection  Discharge Exam: Blood pressure 127/76, pulse 109, temperature 98.5 F (36.9 C), temperature source Oral, resp. rate 16, SpO2 98.00%. Alert without c/o leg or buttock pain. decreased numbness left foot, improved sensation. Incision with Dermabond, no erythema, swelling, or drainage. Good strength BLE.   Disposition: D/C to home, self care. Rx's to pt by Dr. Venetia Maxon (Hydrocodone & Flexeril). Pt will call office to schedule 3wk f/u with Dr. Venetia Maxon. She verbalizes understanding of d/c instructions.     Medication List     As of 04/30/2012  9:10 AM    TAKE these medications         atorvastatin 10 MG tablet   Commonly known as: LIPITOR   Take 10 mg by mouth daily.      cyclobenzaprine 10 MG tablet   Commonly known as: FLEXERIL   Take 1 tablet (10 mg total) by mouth 3  (three) times daily as needed for muscle spasms.      estradiol 1 MG tablet   Commonly known as: ESTRACE   Take 1 mg by mouth daily.      HYDROcodone-acetaminophen 5-325 MG per tablet   Commonly known as: NORCO/VICODIN   Take 1-2 tablets by mouth every 4 (four) hours as needed for pain.      lisinopril 20 MG tablet   Commonly known as: PRINIVIL,ZESTRIL   Take 20 mg by mouth daily.      meloxicam 15 MG tablet   Commonly known as: MOBIC   Take 15 mg by mouth daily.      metFORMIN 500 MG (MOD) 24 hr tablet   Commonly known as: GLUMETZA   Take 1,500 mg by mouth daily with supper.      mupirocin ointment 2 %   Commonly known as: BACTROBAN   Place 1 application into the nose 2 (two) times daily. Patient started with PM dose of 04/28/12      oxyCODONE-acetaminophen 10-325 MG per tablet   Commonly known as: PERCOCET   Take 1 tablet by mouth every 4 (four) hours as needed.      sitaGLIPtin 100 MG tablet   Commonly known as: JANUVIA   Take 100 mg by mouth daily.         Signed: Georgiann Cocker 04/30/2012, 9:10 AM

## 2012-04-30 NOTE — Discharge Summary (Signed)
As above.

## 2013-09-26 IMAGING — CR DG LUMBAR SPINE 1V
1 series · 1 of 1 positions shown · non-contrast
Comparison: Lumbar spine radiograph 04/21/2012.

CLINICAL DATA: L4-L5 microdiskectomy.

LUMBAR SPINE - 1 VIEW

[view not recorded]
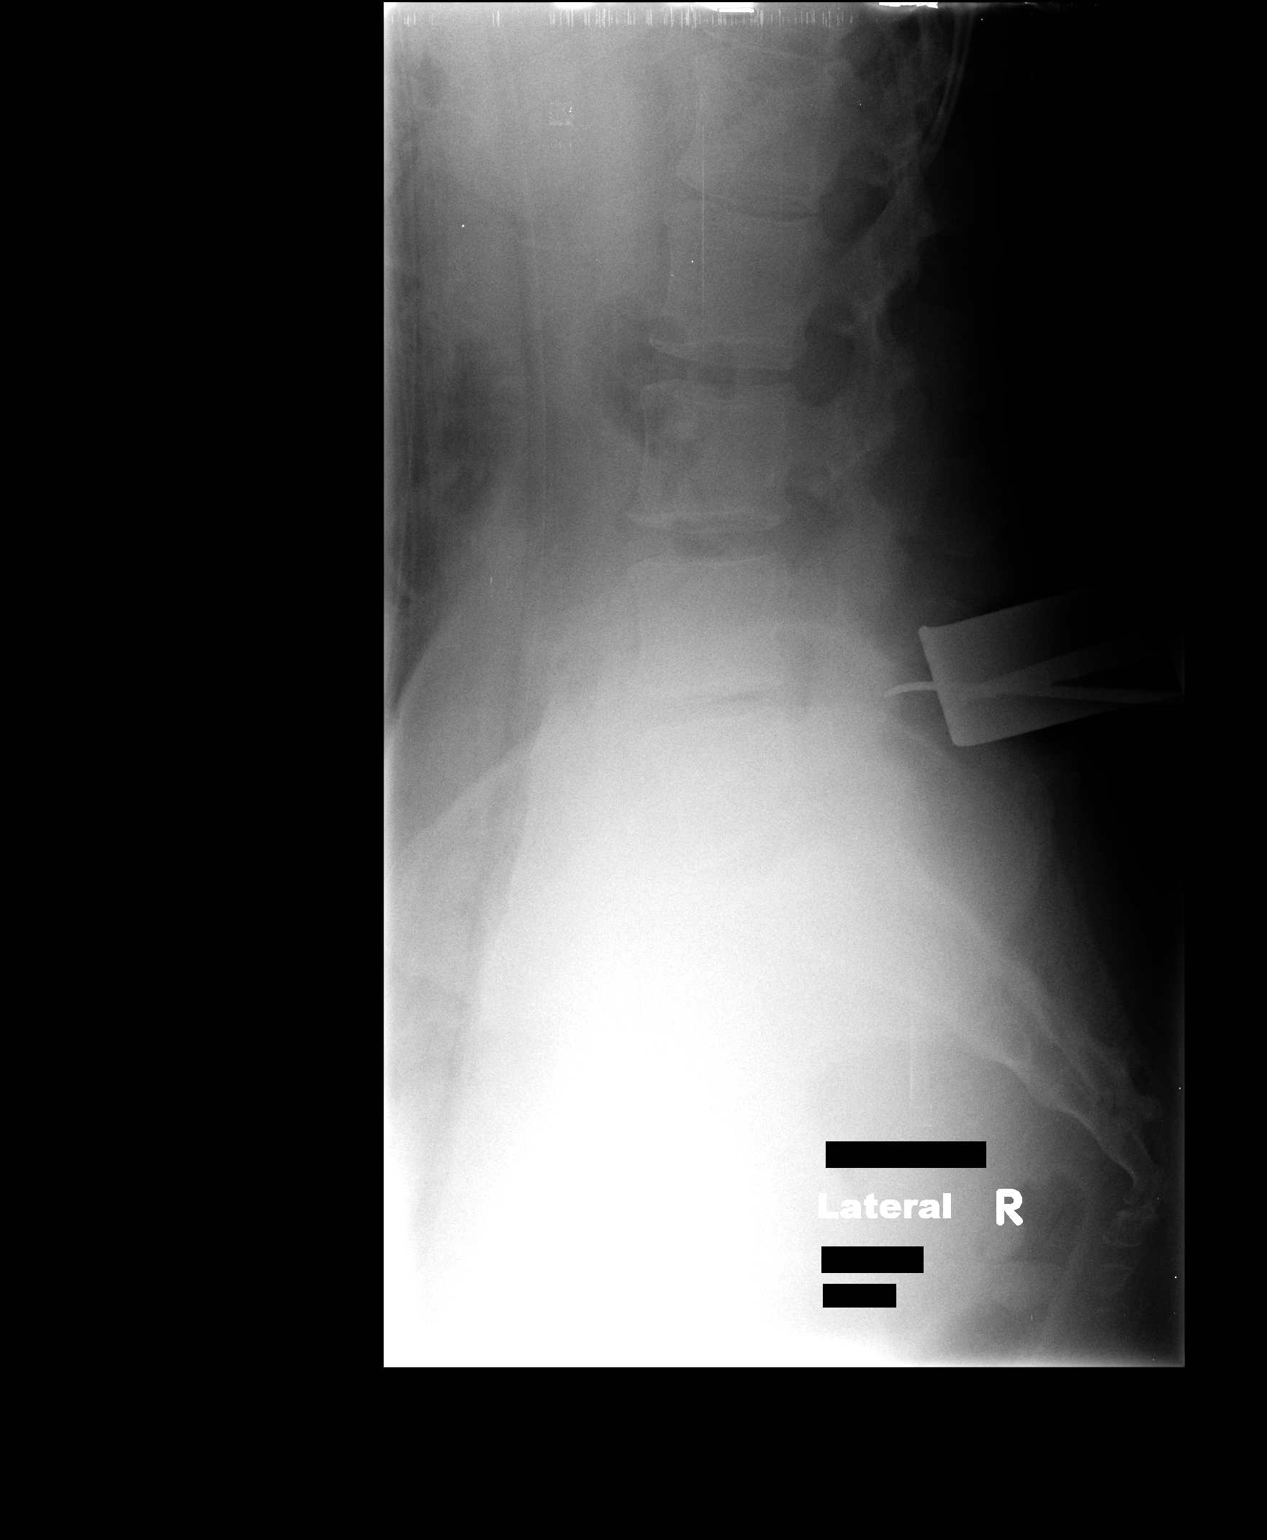

[1 of 1 positions shown; findings below may reference images not displayed]

FINDINGS: Single lateral intraoperative view of the lumbar spine is
submitted for evaluation demonstrating soft tissue retractors and a
surgical probe in place posterior to the L4-L5 interspace.
IMPRESSION: 1.  Intraoperative localization, as above.

## 2017-12-01 ENCOUNTER — Other Ambulatory Visit: Payer: Self-pay | Admitting: Orthopedic Surgery

## 2017-12-12 DIAGNOSIS — S52202K Unspecified fracture of shaft of left ulna, subsequent encounter for closed fracture with nonunion: Secondary | ICD-10-CM

## 2017-12-12 HISTORY — DX: Unspecified fracture of shaft of left ulna, subsequent encounter for closed fracture with nonunion: S52.202K

## 2017-12-31 ENCOUNTER — Encounter (HOSPITAL_BASED_OUTPATIENT_CLINIC_OR_DEPARTMENT_OTHER): Payer: Self-pay | Admitting: *Deleted

## 2017-12-31 ENCOUNTER — Other Ambulatory Visit: Payer: Self-pay

## 2018-01-07 ENCOUNTER — Encounter (HOSPITAL_BASED_OUTPATIENT_CLINIC_OR_DEPARTMENT_OTHER): Payer: Self-pay | Admitting: *Deleted

## 2018-01-07 ENCOUNTER — Encounter (HOSPITAL_BASED_OUTPATIENT_CLINIC_OR_DEPARTMENT_OTHER)
Admission: RE | Disposition: A | Discharge status: Home / Self Care | Payer: Self-pay | Source: Ambulatory Visit | Attending: Orthopedic Surgery

## 2018-01-07 ENCOUNTER — Ambulatory Visit (HOSPITAL_BASED_OUTPATIENT_CLINIC_OR_DEPARTMENT_OTHER): Payer: No Typology Code available for payment source | Admitting: Anesthesiology

## 2018-01-07 ENCOUNTER — Ambulatory Visit (HOSPITAL_BASED_OUTPATIENT_CLINIC_OR_DEPARTMENT_OTHER)
Admission: RE | Admit: 2018-01-07 | Discharge: 2018-01-07 | Disposition: A | Discharge status: Home / Self Care | Payer: No Typology Code available for payment source | Source: Ambulatory Visit | Attending: Orthopedic Surgery | Admitting: Orthopedic Surgery

## 2018-01-07 ENCOUNTER — Other Ambulatory Visit: Payer: Self-pay

## 2018-01-07 DIAGNOSIS — S52612A Displaced fracture of left ulna styloid process, initial encounter for closed fracture: Secondary | ICD-10-CM | POA: Diagnosis not present

## 2018-01-07 DIAGNOSIS — Z96653 Presence of artificial knee joint, bilateral: Secondary | ICD-10-CM | POA: Insufficient documentation

## 2018-01-07 DIAGNOSIS — I10 Essential (primary) hypertension: Secondary | ICD-10-CM | POA: Insufficient documentation

## 2018-01-07 DIAGNOSIS — E119 Type 2 diabetes mellitus without complications: Secondary | ICD-10-CM | POA: Diagnosis not present

## 2018-01-07 DIAGNOSIS — Z7982 Long term (current) use of aspirin: Secondary | ICD-10-CM | POA: Insufficient documentation

## 2018-01-07 DIAGNOSIS — Z79899 Other long term (current) drug therapy: Secondary | ICD-10-CM | POA: Insufficient documentation

## 2018-01-07 DIAGNOSIS — E7800 Pure hypercholesterolemia, unspecified: Secondary | ICD-10-CM | POA: Insufficient documentation

## 2018-01-07 DIAGNOSIS — X58XXXA Exposure to other specified factors, initial encounter: Secondary | ICD-10-CM | POA: Insufficient documentation

## 2018-01-07 DIAGNOSIS — Z794 Long term (current) use of insulin: Secondary | ICD-10-CM | POA: Insufficient documentation

## 2018-01-07 DIAGNOSIS — M1990 Unspecified osteoarthritis, unspecified site: Secondary | ICD-10-CM | POA: Diagnosis not present

## 2018-01-07 HISTORY — DX: Reserved for inherently not codable concepts without codable children: IMO0001

## 2018-01-07 HISTORY — DX: Unspecified cataract: H26.9

## 2018-01-07 HISTORY — PX: ULNAR SHORTENING WITH BONE GRAFT: SHX6330

## 2018-01-07 HISTORY — DX: Long term (current) use of insulin: Z79.4

## 2018-01-07 HISTORY — DX: Type 2 diabetes mellitus without complications: E11.9

## 2018-01-07 HISTORY — DX: Dental restoration status: Z98.811

## 2018-01-07 HISTORY — DX: Unspecified fracture of shaft of left ulna, subsequent encounter for closed fracture with nonunion: S52.202K

## 2018-01-07 LAB — GLUCOSE, CAPILLARY: Glucose-Capillary: 131 mg/dL — ABNORMAL HIGH (ref 70–99)

## 2018-01-07 LAB — POCT I-STAT, CHEM 8
BUN: 14 mg/dL (ref 8–23)
CALCIUM ION: 1.19 mmol/L (ref 1.15–1.40)
CREATININE: 0.6 mg/dL (ref 0.44–1.00)
Chloride: 107 mmol/L (ref 98–111)
Glucose, Bld: 167 mg/dL — ABNORMAL HIGH (ref 70–99)
HEMATOCRIT: 43 % (ref 36.0–46.0)
HEMOGLOBIN: 14.6 g/dL (ref 12.0–15.0)
Potassium: 4.3 mmol/L (ref 3.5–5.1)
SODIUM: 141 mmol/L (ref 135–145)
TCO2: 21 mmol/L — AB (ref 22–32)

## 2018-01-07 SURGERY — ULNAR SHORTENING WITH BONE GRAFT
Anesthesia: Monitor Anesthesia Care | Site: Arm Lower | Laterality: Left

## 2018-01-07 MED ORDER — PROPOFOL 500 MG/50ML IV EMUL
INTRAVENOUS | Status: DC | PRN
  Administered 2018-01-07: 200 ug/kg/min via INTRAVENOUS
  Administered 2018-01-07: 75 ug/kg/min via INTRAVENOUS

## 2018-01-07 MED ORDER — DEXAMETHASONE SODIUM PHOSPHATE 10 MG/ML IJ SOLN
INTRAMUSCULAR | Status: DC | PRN
  Administered 2018-01-07: 5 mg via INTRAVENOUS

## 2018-01-07 MED ORDER — ONDANSETRON HCL 4 MG/2ML IJ SOLN
INTRAMUSCULAR | Status: AC
  Filled 2018-01-07: qty 2

## 2018-01-07 MED ORDER — OXYCODONE-ACETAMINOPHEN 5-325 MG PO TABS: 1.0000 | ORAL_TABLET | ORAL | 0 refills | Status: AC | PRN

## 2018-01-07 MED ORDER — MIDAZOLAM HCL 2 MG/2ML IJ SOLN
INTRAMUSCULAR | Status: AC
  Filled 2018-01-07: qty 2

## 2018-01-07 MED ORDER — FENTANYL CITRATE (PF) 100 MCG/2ML IJ SOLN
INTRAMUSCULAR | Status: AC
  Filled 2018-01-07: qty 2

## 2018-01-07 MED ORDER — BUPIVACAINE-EPINEPHRINE (PF) 0.5% -1:200000 IJ SOLN
INTRAMUSCULAR | Status: DC | PRN
  Administered 2018-01-07: 30 mL via PERINEURAL

## 2018-01-07 MED ORDER — PROPOFOL 500 MG/50ML IV EMUL
INTRAVENOUS | Status: AC
  Filled 2018-01-07: qty 50

## 2018-01-07 MED ORDER — FENTANYL CITRATE (PF) 100 MCG/2ML IJ SOLN
50.0000 ug | INTRAMUSCULAR | Status: DC | PRN
  Administered 2018-01-07: 50 ug via INTRAVENOUS
  Administered 2018-01-07: 25 ug via INTRAVENOUS

## 2018-01-07 MED ORDER — MIDAZOLAM HCL 2 MG/2ML IJ SOLN
1.0000 mg | INTRAMUSCULAR | Status: DC | PRN
  Administered 2018-01-07: 2 mg via INTRAVENOUS

## 2018-01-07 MED ORDER — FENTANYL CITRATE (PF) 100 MCG/2ML IJ SOLN: 25.0000 ug | INTRAMUSCULAR | Status: DC | PRN

## 2018-01-07 MED ORDER — CEFAZOLIN SODIUM-DEXTROSE 2-4 GM/100ML-% IV SOLN
INTRAVENOUS | Status: AC
Start: 2018-01-07 — End: ?
  Filled 2018-01-07: qty 100

## 2018-01-07 MED ORDER — LIDOCAINE HCL (CARDIAC) PF 100 MG/5ML IV SOSY
PREFILLED_SYRINGE | INTRAVENOUS | Status: DC | PRN
  Administered 2018-01-07: 40 mg via INTRAVENOUS

## 2018-01-07 MED ORDER — PROMETHAZINE HCL 25 MG/ML IJ SOLN: 6.2500 mg | INTRAMUSCULAR | Status: DC | PRN

## 2018-01-07 MED ORDER — LIDOCAINE HCL (CARDIAC) PF 100 MG/5ML IV SOSY
PREFILLED_SYRINGE | INTRAVENOUS | Status: AC
  Filled 2018-01-07: qty 5

## 2018-01-07 MED ORDER — ONDANSETRON HCL 4 MG/2ML IJ SOLN
INTRAMUSCULAR | Status: DC | PRN
  Administered 2018-01-07: 4 mg via INTRAVENOUS

## 2018-01-07 MED ORDER — LACTATED RINGERS IV SOLN
INTRAVENOUS | Status: DC
  Administered 2018-01-07: 08:00:00 via INTRAVENOUS

## 2018-01-07 MED ORDER — SCOPOLAMINE 1 MG/3DAYS TD PT72: 1.0000 | MEDICATED_PATCH | Freq: Once | TRANSDERMAL | Status: DC | PRN

## 2018-01-07 MED ORDER — CEFAZOLIN SODIUM-DEXTROSE 2-4 GM/100ML-% IV SOLN
2.0000 g | INTRAVENOUS | Status: AC
  Administered 2018-01-07: 2 g via INTRAVENOUS

## 2018-01-07 MED ORDER — DEXAMETHASONE SODIUM PHOSPHATE 10 MG/ML IJ SOLN
INTRAMUSCULAR | Status: AC
  Filled 2018-01-07: qty 1

## 2018-01-07 MED ORDER — CHLORHEXIDINE GLUCONATE 4 % EX LIQD: 60.0000 mL | Freq: Once | CUTANEOUS | Status: DC

## 2018-01-07 SURGICAL SUPPLY — 83 items
BAG DECANTER FOR FLEXI CONT (MISCELLANEOUS) IMPLANT
BLADE AVERAGE 25MMX9MM (BLADE) ×1
BLADE AVERAGE 25X9 (BLADE) ×3 IMPLANT
BLADE MINI RND TIP GREEN BEAV (BLADE) ×4 IMPLANT
BLADE SURG 15 STRL LF DISP TIS (BLADE) ×2 IMPLANT
BLADE SURG 15 STRL SS (BLADE) ×2
BNDG COHESIVE 3X5 TAN STRL LF (GAUZE/BANDAGES/DRESSINGS) ×8 IMPLANT
BNDG ESMARK 4X9 LF (GAUZE/BANDAGES/DRESSINGS) ×4 IMPLANT
BNDG GAUZE ELAST 4 BULKY (GAUZE/BANDAGES/DRESSINGS) ×4 IMPLANT
BONE CHIP PRESERV 5CC PCAN5 (Bone Implant) ×4 IMPLANT
BUR EGG 3PK/BX (BURR) IMPLANT
CANISTER SUCT 1200ML W/VALVE (MISCELLANEOUS) IMPLANT
CHLORAPREP W/TINT 26ML (MISCELLANEOUS) ×4 IMPLANT
CORD BIPOLAR FORCEPS 12FT (ELECTRODE) ×4 IMPLANT
COVER BACK TABLE 60X90IN (DRAPES) ×4 IMPLANT
COVER MAYO STAND STRL (DRAPES) ×4 IMPLANT
CUFF TOURNIQUET SINGLE 18IN (TOURNIQUET CUFF) ×4 IMPLANT
DECANTER SPIKE VIAL GLASS SM (MISCELLANEOUS) IMPLANT
DRAIN PENROSE 1/2X12 LTX STRL (WOUND CARE) IMPLANT
DRAIN TLS ROUND 10FR (DRAIN) IMPLANT
DRAPE EXTREMITY T 121X128X90 (DRAPE) ×4 IMPLANT
DRAPE INCISE IOBAN 66X45 STRL (DRAPES) IMPLANT
DRAPE LAPAROTOMY 100X72 PEDS (DRAPES) IMPLANT
DRAPE OEC MINIVIEW 54X84 (DRAPES) ×4 IMPLANT
DRAPE SURG 17X23 STRL (DRAPES) ×4 IMPLANT
DRSG PAD ABDOMINAL 8X10 ST (GAUZE/BANDAGES/DRESSINGS) IMPLANT
ELECT REM PT RETURN 9FT ADLT (ELECTROSURGICAL)
ELECTRODE REM PT RTRN 9FT ADLT (ELECTROSURGICAL) IMPLANT
GAUZE SPONGE 4X4 12PLY STRL (GAUZE/BANDAGES/DRESSINGS) ×4 IMPLANT
GAUZE SPONGE 4X4 16PLY XRAY LF (GAUZE/BANDAGES/DRESSINGS) ×4 IMPLANT
GAUZE XEROFORM 1X8 LF (GAUZE/BANDAGES/DRESSINGS) ×8 IMPLANT
GLOVE BIO SURGEON STRL SZ 6.5 (GLOVE) ×3 IMPLANT
GLOVE BIO SURGEON STRL SZ7 (GLOVE) ×4 IMPLANT
GLOVE BIO SURGEONS STRL SZ 6.5 (GLOVE) ×1
GLOVE BIOGEL PI IND STRL 7.0 (GLOVE) ×4 IMPLANT
GLOVE BIOGEL PI IND STRL 8 (GLOVE) ×2 IMPLANT
GLOVE BIOGEL PI IND STRL 8.5 (GLOVE) ×2 IMPLANT
GLOVE BIOGEL PI INDICATOR 7.0 (GLOVE) ×4
GLOVE BIOGEL PI INDICATOR 8 (GLOVE) ×2
GLOVE BIOGEL PI INDICATOR 8.5 (GLOVE) ×2
GLOVE SURG ORTHO 8.0 STRL STRW (GLOVE) ×4 IMPLANT
GOWN STRL REUS W/ TWL LRG LVL3 (GOWN DISPOSABLE) ×2 IMPLANT
GOWN STRL REUS W/TWL LRG LVL3 (GOWN DISPOSABLE) ×2
GOWN STRL REUS W/TWL XL LVL3 (GOWN DISPOSABLE) ×8 IMPLANT
KIT MINI BIO ANCHOR DRILL (KITS) IMPLANT
LOOP VESSEL MAXI BLUE (MISCELLANEOUS) IMPLANT
NS IRRIG 1000ML POUR BTL (IV SOLUTION) ×4 IMPLANT
PACK BASIN DAY SURGERY FS (CUSTOM PROCEDURE TRAY) ×4 IMPLANT
PAD CAST 3X4 CTTN HI CHSV (CAST SUPPLIES) ×2 IMPLANT
PAD CAST 4YDX4 CTTN HI CHSV (CAST SUPPLIES) ×2 IMPLANT
PADDING CAST ABS 3INX4YD NS (CAST SUPPLIES) ×2
PADDING CAST ABS 4INX4YD NS (CAST SUPPLIES) ×2
PADDING CAST ABS COTTON 3X4 (CAST SUPPLIES) ×2 IMPLANT
PADDING CAST ABS COTTON 4X4 ST (CAST SUPPLIES) ×2 IMPLANT
PADDING CAST COTTON 3X4 STRL (CAST SUPPLIES) ×2
PADDING CAST COTTON 4X4 STRL (CAST SUPPLIES) ×2
PENCIL BUTTON HOLSTER BLD 10FT (ELECTRODE) IMPLANT
SLEEVE SCD COMPRESS KNEE MED (MISCELLANEOUS) ×4 IMPLANT
SLING ARM FOAM STRAP LRG (SOFTGOODS) IMPLANT
SLING ARM MED ADULT FOAM STRAP (SOFTGOODS) IMPLANT
SPLINT PLASTER CAST XFAST 3X15 (CAST SUPPLIES) IMPLANT
SPLINT PLASTER XTRA FASTSET 3X (CAST SUPPLIES)
SPONGE LAP 4X18 RFD (DISPOSABLE) IMPLANT
STOCKINETTE 4X48 STRL (DRAPES) ×4 IMPLANT
SUCTION FRAZIER HANDLE 10FR (MISCELLANEOUS) ×2
SUCTION TUBE FRAZIER 10FR DISP (MISCELLANEOUS) ×2 IMPLANT
SUT CHROMIC 4 0 RB 1X27 (SUTURE) IMPLANT
SUT ETHIBOND 3-0 V-5 (SUTURE) IMPLANT
SUT ETHILON 4 0 PS 2 18 (SUTURE) ×4 IMPLANT
SUT MERSILENE 4 0 P 3 (SUTURE) IMPLANT
SUT NOVA NAB GS-22 2 0 T19 (SUTURE) IMPLANT
SUT VIC AB 0 CT1 27 (SUTURE)
SUT VIC AB 0 CT1 27XBRD ANBCTR (SUTURE) IMPLANT
SUT VIC AB 2-0 PS2 27 (SUTURE) ×4 IMPLANT
SUT VIC AB 2-0 SH 27 (SUTURE)
SUT VIC AB 2-0 SH 27XBRD (SUTURE) IMPLANT
SUT VICRYL 4-0 PS2 18IN ABS (SUTURE) ×4 IMPLANT
SYR BULB 3OZ (MISCELLANEOUS) ×4 IMPLANT
SYR CONTROL 10ML LL (SYRINGE) IMPLANT
TOWEL GREEN STERILE FF (TOWEL DISPOSABLE) ×4 IMPLANT
TUBE CONNECTING 20'X1/4 (TUBING)
TUBE CONNECTING 20X1/4 (TUBING) IMPLANT
UNDERPAD 30X30 (UNDERPADS AND DIAPERS) ×4 IMPLANT

## 2018-01-07 NOTE — Anesthesia Postprocedure Evaluation (Signed)
Anesthesia Post Note  Patient: Phyllis Leonard  Procedure(s) Performed: Repair ULNAR styloid non-union WITH BONE GRAFT (Left Arm Lower)     Patient location during evaluation: PACU Anesthesia Type: Regional and General Level of consciousness: awake and alert Pain management: pain level controlled Vital Signs Assessment: post-procedure vital signs reviewed and stable Respiratory status: spontaneous breathing, nonlabored ventilation, respiratory function stable and patient connected to nasal cannula oxygen Cardiovascular status: blood pressure returned to baseline and stable Postop Assessment: no apparent nausea or vomiting Anesthetic complications: no    Last Vitals:  Vitals:   01/07/18 1000 01/07/18 1132  BP: 119/64 117/86  Pulse: 87 84  Resp: 17 18  Temp:  36.7 C  SpO2: 94% 96%    Last Pain:  Vitals:   01/07/18 1132  TempSrc:   PainSc: 0-No pain                 Aniah Pauli

## 2018-01-07 NOTE — Op Note (Signed)
NAME: Phyllis Leonard MEDICAL RECORD NO: [id number] DATE OF BIRTH: 1956/01/05 FACILITY: [facility] LOCATION: [facility] PHYSICIAN: Wynonia Sours, MD   OPERATIVE REPORT   DATE OF PROCEDURE: 01/07/18    PREOPERATIVE DIAGNOSIS:   Nonunion ulnar styloid left wrist   POSTOPERATIVE DIAGNOSIS: Same   PROCEDURE:   Repair nonunion with allograft tension band wiring   SURGEON: Daryll Brod, M.D.   ASSISTANT: none Leanora Cover, MD   ANESTHESIA:  Regional with sedation and A regional block had been performed by anesthesia in preoperative holding.    INTRAVENOUS FLUIDS:  Per anesthesia flow sheet.   ESTIMATED BLOOD LOSS:  Minimal.   COMPLICATIONS:  None.   SPECIMENS:   None TOURNIQUET TIME:    Total Tourniquet Time Documented: Upper Arm (Left) - 42 minutes Total: Upper Arm (Left) - 42 minutes    DISPOSITION:  Stable to PACU.   INDICATIONS: Patient is a 62 year old female with a history of an injury to her left wrist.  She has developed pain on the ulnar side.  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  MRI reveals the nonunion site it is mildly unstable and painful to stress.  This is not responded to conservative treatment she is like to undergo repair of the nonunion site with bone graft versus excision of the nonunion and repair to the ulna dictated by findings at the time of surgery.  She is aware that there is no guarantee to the surgery the possibility of infection recurrence injury to arteries nerves tendons and complete relief symptoms dystrophy.  Preoperative.  Patient seen extremity marked by both patient surgeon antibiotic given  OPERATIVE COURSE: Procedure patient is brought to the operating room after a supraclavicular block was carried out without difficulty in the preoperative area.  She was prepped using ChloraPrep in supine position with a left arm free.  A three-minute dry time was allowed  timeout taken to confirm patient procedure.  The limb was exsanguinated with an Esmarch bandage turn placed on the arm was inflated to 250 mmHg.  A curvilinear incision was made over the ulnar aspect of the left forearm carried down to subcutaneous tissue..  Bleeders were electrocauterized with bipolar.  The dorsal sensory branch of the ulnar nerve was searched for was found retracted proximally.  An incision was then made volar to the extensor carpi ulnaris tendon.  This carried down to the bone.  This carried distally until the nonunion site was identified.  This was freely movable.  The nonunion site was then opened it was cleared with a small rondure and house curette down to bleeding bone both proximally and distally.  Wound was copiously irrigated with saline.  The shaft of the ulna was then isolated proximally retractors placed and drill holes placed for placement of 22-gauge wire.  This was inserted through the hole on the ulnar aspect of the ulna.  The nonunion styloid was then isolated placed in position and then pinned with 2 45 K wires.  This was done under image intensification.  The tension band wire twisted and placed volarly.  The 2 K wires were then cut bent and then driven into the proximal cortex to nearly flush with the ulnar styloid.  Position was confirmed on x-ray.  The wound was again irrigated.  The retinaculum of the extensor carpi ulnaris was then repaired with a running 4-0 Vicryl suture.  The subcutaneous tissue was closed  with interrupted 4-0 Vicryl and skin with interrupted 4-0 nylon sutures.  A sterile compressive dressing long-arm splint with the wrist in neutral position was then applied and deflation of the tourniquet all fingers immediately pink.  She was taken to the recovery room for observation in satisfactory condition.  She will be discharged home to return [facility] in 1 week on Percocet.  Intensification confirmed positioning of both the K wires and the tension band  wire.  This was fully tightened down compressing the nonunion site after the allograft was placed this was morselized crushed and placed into the defect.  The twisted wire was cut short been under the proximal aspect  Wynonia Sours, MD Electronically signed, 01/07/18

## 2018-01-07 NOTE — Op Note (Signed)
I assisted Surgeon(s) and Role:    * Daryll Brod, MD - Primary    Leanora Cover, MD - Assisting on the Procedure(s): Repair ULNAR styloid non-union WITH BONE GRAFT on 01/07/2018.  I provided assistance on this case as follows: retraction soft tissues, placement hardware.  Electronically signed by: Tennis Must, MD Date: 01/07/2018 Time: 11:31 AM

## 2018-01-07 NOTE — Anesthesia Procedure Notes (Signed)
Procedure Name: MAC Performed by: Xolani Degracia W, CRNA Pre-anesthesia Checklist: Patient identified, Timeout performed, Emergency Drugs available, Suction available and Patient being monitored Patient Re-evaluated:Patient Re-evaluated prior to induction Oxygen Delivery Method: Simple face mask       

## 2018-01-07 NOTE — Anesthesia Procedure Notes (Signed)
Anesthesia Regional Block: Supraclavicular block   Pre-Anesthetic Checklist: ,, timeout performed, Correct Patient, Correct Site, Correct Laterality, Correct Procedure, Correct Position, site marked, Risks and benefits discussed,  Surgical consent,  Pre-op evaluation,  At surgeon's request and post-op pain management  Laterality: Left  Prep: chloraprep       Needles:  Injection technique: Single-shot  Needle Type: Echogenic Needle     Needle Length: 9cm  Needle Gauge: 21     Additional Needles:   Procedures:,,,, ultrasound used (permanent image in chart),,,,  Narrative:  Start time: 01/07/2018 8:37 AM End time: 01/07/2018 8:43 AM Injection made incrementally with aspirations every 5 mL.  Performed by: Personally  Anesthesiologist: Catalina Gravel, MD  Additional Notes: No pain on injection. No increased resistance to injection. Injection made in 5cc increments.  Good needle visualization.  Patient tolerated procedure well.

## 2018-01-07 NOTE — Progress Notes (Signed)
Assisted Dr. Turk with left, ultrasound guided, supraclavicular block. Side rails up, monitors on throughout procedure. See vital signs in flow sheet. Tolerated Procedure well. 

## 2018-01-07 NOTE — H&P (Signed)
Phyllis Leonard is an 62 y.o. female.   Chief Complaint:left wrist pain HPI: Phyllis Leonard is a 62 year old right-hand-dominant female referred by Dr. Megan Salon for consultation regarding pain swelling of her left wrist ulnar side. This been going on for the past month. She has no history of injury. She states that she has a dull pain which progressed to sharp with any use with a VAS score 9-10 over 10. This is all related to the ulnar side. She states it is gotten progressively worse. She has been taking Celebrex and wearing a cock-up splint. She has not had an MRI done non-arthrographic revealing an ununited fracture of the ulnar styloid process the extensor carpi ulnaris tendinitis and severe degenerative changes at the carpometacarpal joint of her thumb. He does not localize any particular activity that seems to aggravate this for her. She has a history of diabetes arthritis no history of thyroid problems and gout. Family history is positive diabetes negative for thyroid problems arthritis and gout. She has been wearing a cock-up splint which gives her little relief.She was sent for MRI.This reveals a nonunion ulnar styloid with attached TFCC to the styloid fragment.              Past Medical History:  Diagnosis Date  . Arthritis    "all over"  . Dental crowns present   . Hypercholesterolemia   . Hypertension    under control with med., has been on med. x 6-8 yr.  . Immature cataract   . Insulin dependent diabetes mellitus ([place])   . 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 ABDOMINAL HYSTERECTOMY     partial  . CHOLECYSTECTOMY    . LUMBAR LAMINECTOMY     prior to 2013 surgery  . LUMBAR LAMINECTOMY/DECOMPRESSION MICRODISCECTOMY  04/29/2012   Procedure: LUMBAR LAMINECTOMY/DECOMPRESSION MICRODISCECTOMY 1 LEVEL;  Surgeon: Erline Levine, MD;  Location: [facility];  Service: Neurosurgery;  Laterality: Left;  Left Lumbar  Four-Five Redo Microdiskectomy  . SHOULDER ARTHROSCOPY W/ ROTATOR CUFF REPAIR Left   . TOTAL KNEE ARTHROPLASTY Bilateral     History reviewed. No pertinent family history. Social History:  reports that she has never smoked. She has never used smokeless tobacco. She reports that she does not drink alcohol or use drugs.  Allergies: No Known Allergies  No medications prior to admission.    No results found for this or any previous visit (from the past 48 hour(s)).  No results found.   Pertinent items are noted in HPI.  Height 5\' 4"  (1.626 m), weight 81.6 kg (180 lb).  General appearance: alert, cooperative and appears stated age Head: Normocephalic, without obvious abnormality Neck: no JVD Resp: clear to auscultation bilaterally Cardio: regular rate and rhythm, S1, S2 normal, no murmur, click, rub or gallop GI: soft, non-tender; bowel sounds normal; no masses,  no organomegaly Extremities: left wrist ulnar pain Pulses: 2+ and symmetric Skin: Skin color, texture, turgor normal. No rashes or lesions Neurologic: Grossly normal Incision/Wound: na  Assessment/Plan Assessment:  1. 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 This can be either excision of the ulnar styloid with reattachment of the TFCC to the ulna. This if the styloid fracture is large enough and the fragment big enough to hold internal fixation this can be a  consideration along with bone grafting. Risks and benefits of each of these are discussed with him. He there is aware that there is no guarantee with either surgery that attempting to get this bone to heal may fail even with a bone graft. That repair to bone in and of itself with excision of the styloid sometimes he will also. This would require further intervention in the future. Pre-peri-and  postoperative course are discussed along with risks and complications. They are aware that there is no guarantee to the surgery the possibility of infection recurrence injury to arteries nerves tendons complete relief symptoms dystrophy. She would like to proceed she she is scheduled for repair of TFCC with excision of the styloid or repair of nonunion of the ulnar styloid depending on the fracture fragment. This to her left wrist. This will be scheduled in outpatient under regional anesthesia. Questions are encouraged and answered to their satisfaction.      Deaysia Grigoryan R 01/07/2018, 5:34 AM

## 2018-01-07 NOTE — Anesthesia Preprocedure Evaluation (Signed)
Anesthesia Evaluation  Patient identified by MRN, date of birth, ID band Patient awake    Reviewed: Allergy & Precautions, NPO status , Patient's Chart, lab work & pertinent test results  Airway Mallampati: II  TM Distance: >3 FB Neck ROM: Full    Dental  (+) Teeth Intact, Dental Advisory Given   Pulmonary neg pulmonary ROS,    Pulmonary exam normal breath sounds clear to auscultation       Cardiovascular hypertension, Pt. on medications Normal cardiovascular exam Rhythm:Regular Rate:Normal     Neuro/Psych negative neurological ROS  negative psych ROS   GI/Hepatic negative GI ROS, Neg liver ROS,   Endo/Other  diabetes, Type 2, Insulin Dependent, Oral Hypoglycemic AgentsObesity   Renal/GU negative Renal ROS     Musculoskeletal  (+) Arthritis , Nonunion fracture of left ulna   Abdominal   Peds  Hematology negative hematology ROS (+)   Anesthesia Other Findings Day of surgery medications reviewed with the patient.  Reproductive/Obstetrics                            Anesthesia Physical Anesthesia Plan  ASA: II  Anesthesia Plan: Regional and MAC   Post-op Pain Management:    Induction: Intravenous  PONV Risk Score and Plan: 2 and Propofol infusion, Ondansetron and Midazolam  Airway Management Planned: Nasal Cannula and Natural Airway  Additional Equipment:   Intra-op Plan:   Post-operative Plan:   Informed Consent: I have reviewed the patients History and Physical, chart, labs and discussed the procedure including the risks, benefits and alternatives for the proposed anesthesia with the patient or authorized representative who has indicated his/her understanding and acceptance.   Dental advisory given  Plan Discussed with:   Anesthesia Plan Comments:         Anesthesia Quick Evaluation

## 2018-01-07 NOTE — Brief Op Note (Signed)
01/07/2018  11:31 AM  PATIENT:  Phyllis Leonard  62 y.o. female  PRE-OPERATIVE DIAGNOSIS:  non union ulnar styloid left wrist  POST-OPERATIVE DIAGNOSIS:  non union ulnar styloid left wrist  PROCEDURE:  Procedure(s) with comments: Repair ULNAR styloid non-union WITH BONE GRAFT (Left) - axillary block  SURGEON:  Surgeon(s) and Role:    * Daryll Brod, MD - Primary    * Leanora Cover, MD - Assisting  PHYSICIAN ASSISTANT:   ASSISTANTS: K Muhammadali Ries,MD   ANESTHESIA:   regional and IV sedation  EBL:  3 mL   BLOOD ADMINISTERED:none  DRAINS: none   LOCAL MEDICATIONS USED:  NONE  SPECIMEN:  No Specimen  DISPOSITION OF SPECIMEN:  N/A  COUNTS:  YES  TOURNIQUET:   Total Tourniquet Time Documented: Upper Arm (Left) - 42 minutes Total: Upper Arm (Left) - 42 minutes   DICTATION: .Viviann Spare Dictation  PLAN OF CARE: Discharge to home after PACU  PATIENT DISPOSITION:  PACU - hemodynamically stable.

## 2018-01-07 NOTE — Discharge Instructions (Addendum)

## 2018-01-07 NOTE — Transfer of Care (Signed)
Immediate Anesthesia Transfer of Care Note  Patient: Phyllis Leonard  Procedure(s) Performed: Repair ULNAR styloid non-union WITH BONE GRAFT (Left Arm Lower)  Patient Location: PACU  Anesthesia Type:MAC and Regional  Level of Consciousness: awake, alert  and oriented  Airway & Oxygen Therapy: Patient Spontanous Breathing  Post-op Assessment: Report given to RN and Post -op Vital signs reviewed and stable  Post vital signs: Reviewed and stable  Last Vitals:  Vitals Value Taken Time  BP 117/86 01/07/2018 11:32 AM  Temp    Pulse 84 01/07/2018 11:34 AM  Resp 14 01/07/2018 11:34 AM  SpO2 94 % 01/07/2018 11:34 AM  Vitals shown include unvalidated device data.  Last Pain:  Vitals:   01/07/18 0800  TempSrc: Oral  PainSc: 0-No pain         Complications: No apparent anesthesia complications

## 2018-01-07 NOTE — Anesthesia Postprocedure Evaluation (Signed)
Anesthesia Post Note  Patient: Phyllis Leonard  Procedure(s) Performed: Repair ULNAR styloid non-union WITH BONE GRAFT (Left Arm Lower)     Patient location during evaluation: PACU Anesthesia Type: Regional and MAC Level of consciousness: awake and alert Pain management: pain level controlled Vital Signs Assessment: post-procedure vital signs reviewed and stable Respiratory status: spontaneous breathing, nonlabored ventilation, respiratory function stable and patient connected to nasal cannula oxygen Cardiovascular status: stable and blood pressure returned to baseline Postop Assessment: no apparent nausea or vomiting Anesthetic complications: no    Last Vitals:  Vitals:   01/07/18 1132 01/07/18 1145  BP: 117/86 117/69  Pulse: 84 81  Resp: 18 10  Temp: 36.7 C   SpO2: 96% 98%    Last Pain:  Vitals:   01/07/18 1132  TempSrc:   PainSc: 0-No pain                 [name]

## 2018-01-08 ENCOUNTER — Encounter (HOSPITAL_BASED_OUTPATIENT_CLINIC_OR_DEPARTMENT_OTHER): Payer: Self-pay | Admitting: Orthopedic Surgery

## 2018-02-03 ENCOUNTER — Encounter (INDEPENDENT_AMBULATORY_CARE_PROVIDER_SITE_OTHER): Payer: Self-pay | Admitting: *Deleted

## 2019-02-02 ENCOUNTER — Encounter (INDEPENDENT_AMBULATORY_CARE_PROVIDER_SITE_OTHER): Payer: Self-pay | Admitting: Internal Medicine

## 2019-02-02 ENCOUNTER — Other Ambulatory Visit: Payer: Self-pay

## 2019-02-02 ENCOUNTER — Telehealth (INDEPENDENT_AMBULATORY_CARE_PROVIDER_SITE_OTHER): Payer: Self-pay | Admitting: *Deleted

## 2019-02-02 ENCOUNTER — Other Ambulatory Visit (INDEPENDENT_AMBULATORY_CARE_PROVIDER_SITE_OTHER): Payer: Self-pay | Admitting: *Deleted

## 2019-02-02 ENCOUNTER — Encounter (INDEPENDENT_AMBULATORY_CARE_PROVIDER_SITE_OTHER): Payer: Self-pay | Admitting: *Deleted

## 2019-02-02 ENCOUNTER — Ambulatory Visit (INDEPENDENT_AMBULATORY_CARE_PROVIDER_SITE_OTHER): Payer: Managed Care, Other (non HMO) | Admitting: Internal Medicine

## 2019-02-02 VITALS — BP 120/75 | HR 98 | Temp 98.1°F | Ht 64.0 in | Wt 190.9 lb

## 2019-02-02 DIAGNOSIS — K219 Gastro-esophageal reflux disease without esophagitis: Secondary | ICD-10-CM | POA: Insufficient documentation

## 2019-02-02 DIAGNOSIS — Z860100 Personal history of colon polyps, unspecified: Secondary | ICD-10-CM | POA: Insufficient documentation

## 2019-02-02 MED ORDER — PEG 3350-KCL-NA BICARB-NACL 420 G PO SOLR: 4000.0000 mL | Freq: Once | ORAL | 0 refills | Status: AC

## 2019-02-02 NOTE — Telephone Encounter (Signed)
Patient needs trilyte 

## 2019-02-02 NOTE — Patient Instructions (Signed)
EGD/Colonoscopy. The risks of bleeding, perforation and infection were reviewed with patient.  

## 2019-02-02 NOTE — Progress Notes (Signed)
Subjective:    Patient ID: Phyllis Leonard, female    DOB: 11/30/1955, 63 y.o.   MRN: [id number]  HPI  Referred by Dr. Currie Paris for  Colonoscopy/ EGD. Hx of diverticulitis. She is due for a colonoscopy.  She tells me she has been having problems with her stomach. It wakes her up at night. She has epigastric pain. Once she vomits, the pain will resolve. States since she started the Prilosec, the pain ha resolved. Her appetite is okay. No unintentional weight loss.    06/15/2008: Dr. Docia Furl.  Diverticulosis in the sigmoid colon. There were seveal diverticula with some purlulent material exuding from them which probably represents a subclinical diverticulitis, which may actually have caused some of the bleeding, but apparently she is not having any active abdominal pain and may not require antibiotics. 2 small polyps, both removed by snare and cautery and retrieved (No biopsy sent).   She has 2 children. Retired Therapist, sports from [employer]    Past Medical History:  Diagnosis Date  . Arthritis    "all over"  . Dental crowns present   . Hypercholesterolemia   . Hypertension    under control with med., has been on med. x 6-8 yr.  . Immature cataract   . Insulin dependent diabetes mellitus ([place])   . 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 ABDOMINAL HYSTERECTOMY     partial  . CHOLECYSTECTOMY    . LUMBAR LAMINECTOMY     prior to 2013 surgery  . LUMBAR LAMINECTOMY/DECOMPRESSION MICRODISCECTOMY  04/29/2012   Procedure: LUMBAR LAMINECTOMY/DECOMPRESSION MICRODISCECTOMY 1 LEVEL;  Surgeon: Erline Levine, MD;  Location: [facility];  Service: Neurosurgery;  Laterality: Left;  Left Lumbar Four-Five Redo Microdiskectomy  . SHOULDER ARTHROSCOPY W/ ROTATOR CUFF REPAIR Left   . TOTAL KNEE ARTHROPLASTY Bilateral   . ULNAR SHORTENING WITH BONE GRAFT Left 01/07/2018   Procedure: Repair ULNAR styloid non-union WITH BONE GRAFT;  Surgeon:  Daryll Brod, MD;  Location: [facility];  Service: Orthopedics;  Laterality: Left;  axillary block    No Known Allergies  Current Outpatient Medications on File Prior to Visit  Medication Sig Dispense Refill  . aspirin EC 81 MG tablet Take 81 mg by mouth daily.    Marland Kitchen atorvastatin (LIPITOR) 80 MG tablet Take 80 mg by mouth daily.    . canagliflozin (INVOKANA) 300 MG TABS tablet Take 300 mg by mouth daily before breakfast.    . celecoxib (CELEBREX) 200 MG capsule Take 200 mg by mouth daily.    . Dulaglutide (TRULICITY) 1.5 NT/7.0YF SOPN Inject into the skin once a week.    . Dulaglutide (TRULICITY) 1.5 VC/9.4WH SOPN Inject into the skin.    . DULoxetine (CYMBALTA) 60 MG capsule Take 60 mg by mouth daily.    . Insulin Glargine (BASAGLAR KWIKPEN) 100 UNIT/ML SOPN Inject 65 Units into the skin at bedtime.    . Insulin Glargine (TOUJEO MAX SOLOSTAR [place]) Inject into the skin.    Marland Kitchen losartan (COZAAR) 50 MG tablet Take 50 mg by mouth daily.    . metFORMIN (GLUCOPHAGE-XR) 500 MG 24 hr tablet Take 1,000 mg by mouth 2 (two) times daily.    Marland Kitchen omeprazole (PRILOSEC) 20 MG capsule Take 40 mg by mouth 2 (two) times daily before a meal.    . Dapagliflozin Propanediol (FARXIGA PO) Take 10 mg by mouth daily.     No current  facility-administered medications on file prior to visit.     t.  Objective:   Physical Exam Blood pressure 120/75, pulse 98, temperature 98.1 F (36.7 C), height 5\' 4"  (1.626 m), weight 190 lb 14.4 oz (86.6 kg). Alert and oriented. Skin warm and dry. Oral mucosa is moist.   . Sclera anicteric, conjunctivae is pink. Thyroid not enlarged. No cervical lymphadenopathy. Lungs clear. Heart regular rate and rhythm.  Abdomen is soft. Bowel sounds are positive. No hepatomegaly. No abdominal masses felt. No tenderness.  No edema to lower extremities.        Assessment & Plan:  Colon polyps: Needs surveillance. GERD, epigastric pain: Will schedule an EGD to rule out GERD.  The  risks of bleeding, perforation and infection were reviewed with patient.Alert and oriented. Skin warm and dry. Oral mucosa is moist.   . Sclera anicteric, conjunctivae is pink. Thyroid not enlarged. No cervical lymphadenopathy. Lungs clear. Heart regular rate and rhythm.  Abdomen is soft. Bowel sounds are positive. No hepatomegaly. No abdominal masses felt. No tenderness.  No edema to lower extremities. Patient is alert and oriented.

## 2019-03-29 ENCOUNTER — Other Ambulatory Visit (HOSPITAL_COMMUNITY)
Admission: RE | Admit: 2019-03-29 | Discharge: 2019-03-29 | Disposition: A | Discharge status: Home / Self Care | Payer: No Typology Code available for payment source | Source: Ambulatory Visit | Attending: Internal Medicine | Admitting: Internal Medicine

## 2019-03-29 ENCOUNTER — Other Ambulatory Visit: Payer: Self-pay

## 2019-03-29 DIAGNOSIS — Z20828 Contact with and (suspected) exposure to other viral communicable diseases: Secondary | ICD-10-CM | POA: Diagnosis not present

## 2019-03-29 DIAGNOSIS — Z01812 Encounter for preprocedural laboratory examination: Secondary | ICD-10-CM | POA: Diagnosis present

## 2019-03-29 LAB — SARS CORONAVIRUS 2 (TAT 6-24 HRS): SARS Coronavirus 2: NEGATIVE

## 2019-03-31 ENCOUNTER — Ambulatory Visit (HOSPITAL_COMMUNITY)
Admission: RE | Admit: 2019-03-31 | Discharge: 2019-03-31 | Disposition: A | Discharge status: Home / Self Care | Payer: No Typology Code available for payment source | Attending: Internal Medicine | Admitting: Internal Medicine

## 2019-03-31 ENCOUNTER — Encounter (HOSPITAL_COMMUNITY): Payer: Self-pay | Admitting: *Deleted

## 2019-03-31 ENCOUNTER — Encounter (HOSPITAL_COMMUNITY)
Admission: RE | Disposition: A | Discharge status: Home / Self Care | Payer: Self-pay | Source: Home / Self Care | Attending: Internal Medicine

## 2019-03-31 ENCOUNTER — Other Ambulatory Visit: Payer: Self-pay

## 2019-03-31 DIAGNOSIS — Z794 Long term (current) use of insulin: Secondary | ICD-10-CM | POA: Diagnosis not present

## 2019-03-31 DIAGNOSIS — K5140 Inflammatory polyps of colon without complications: Secondary | ICD-10-CM | POA: Insufficient documentation

## 2019-03-31 DIAGNOSIS — Z860100 Personal history of colon polyps, unspecified: Secondary | ICD-10-CM | POA: Insufficient documentation

## 2019-03-31 DIAGNOSIS — Z96653 Presence of artificial knee joint, bilateral: Secondary | ICD-10-CM | POA: Insufficient documentation

## 2019-03-31 DIAGNOSIS — Z1211 Encounter for screening for malignant neoplasm of colon: Secondary | ICD-10-CM | POA: Insufficient documentation

## 2019-03-31 DIAGNOSIS — Z8719 Personal history of other diseases of the digestive system: Secondary | ICD-10-CM | POA: Diagnosis not present

## 2019-03-31 DIAGNOSIS — K5730 Diverticulosis of large intestine without perforation or abscess without bleeding: Secondary | ICD-10-CM

## 2019-03-31 DIAGNOSIS — K219 Gastro-esophageal reflux disease without esophagitis: Secondary | ICD-10-CM

## 2019-03-31 DIAGNOSIS — Z791 Long term (current) use of non-steroidal anti-inflammatories (NSAID): Secondary | ICD-10-CM | POA: Insufficient documentation

## 2019-03-31 DIAGNOSIS — E7800 Pure hypercholesterolemia, unspecified: Secondary | ICD-10-CM | POA: Diagnosis not present

## 2019-03-31 DIAGNOSIS — K2950 Unspecified chronic gastritis without bleeding: Secondary | ICD-10-CM | POA: Diagnosis not present

## 2019-03-31 DIAGNOSIS — Z7982 Long term (current) use of aspirin: Secondary | ICD-10-CM | POA: Insufficient documentation

## 2019-03-31 DIAGNOSIS — R112 Nausea with vomiting, unspecified: Secondary | ICD-10-CM | POA: Diagnosis not present

## 2019-03-31 DIAGNOSIS — Z79899 Other long term (current) drug therapy: Secondary | ICD-10-CM | POA: Diagnosis not present

## 2019-03-31 DIAGNOSIS — I10 Essential (primary) hypertension: Secondary | ICD-10-CM | POA: Diagnosis not present

## 2019-03-31 DIAGNOSIS — K3189 Other diseases of stomach and duodenum: Secondary | ICD-10-CM

## 2019-03-31 DIAGNOSIS — K2970 Gastritis, unspecified, without bleeding: Secondary | ICD-10-CM

## 2019-03-31 DIAGNOSIS — R1013 Epigastric pain: Secondary | ICD-10-CM | POA: Diagnosis not present

## 2019-03-31 DIAGNOSIS — D125 Benign neoplasm of sigmoid colon: Secondary | ICD-10-CM

## 2019-03-31 DIAGNOSIS — Z09 Encounter for follow-up examination after completed treatment for conditions other than malignant neoplasm: Secondary | ICD-10-CM

## 2019-03-31 DIAGNOSIS — K621 Rectal polyp: Secondary | ICD-10-CM

## 2019-03-31 HISTORY — PX: COLONOSCOPY: SHX5424

## 2019-03-31 HISTORY — PX: BIOPSY: SHX5522

## 2019-03-31 HISTORY — PX: POLYPECTOMY: SHX5525

## 2019-03-31 HISTORY — PX: ESOPHAGOGASTRODUODENOSCOPY: SHX5428

## 2019-03-31 LAB — GLUCOSE, CAPILLARY: Glucose-Capillary: 113 mg/dL — ABNORMAL HIGH (ref 70–99)

## 2019-03-31 SURGERY — EGD (ESOPHAGOGASTRODUODENOSCOPY)
Anesthesia: Moderate Sedation

## 2019-03-31 MED ORDER — LIDOCAINE VISCOUS HCL 2 % MT SOLN
OROMUCOSAL | Status: DC | PRN
  Administered 2019-03-31: 1 via OROMUCOSAL

## 2019-03-31 MED ORDER — FAMOTIDINE 20 MG PO TABS: 20.0000 mg | ORAL_TABLET | Freq: Every evening | ORAL | Status: AC | PRN

## 2019-03-31 MED ORDER — MIDAZOLAM HCL 5 MG/5ML IJ SOLN
INTRAMUSCULAR | Status: DC | PRN
  Administered 2019-03-31: 1 mg via INTRAVENOUS
  Administered 2019-03-31 (×3): 2 mg via INTRAVENOUS
  Administered 2019-03-31: 1 mg via INTRAVENOUS

## 2019-03-31 MED ORDER — SODIUM CHLORIDE 0.9 % IV SOLN
INTRAVENOUS | Status: DC
  Administered 2019-03-31: 11:00:00 via INTRAVENOUS

## 2019-03-31 MED ORDER — MIDAZOLAM HCL 5 MG/5ML IJ SOLN
INTRAMUSCULAR | Status: AC
  Filled 2019-03-31: qty 10

## 2019-03-31 MED ORDER — LIDOCAINE VISCOUS HCL 2 % MT SOLN
OROMUCOSAL | Status: AC
  Filled 2019-03-31: qty 15

## 2019-03-31 MED ORDER — MEPERIDINE HCL 50 MG/ML IJ SOLN
INTRAMUSCULAR | Status: DC | PRN
  Administered 2019-03-31 (×2): 25 mg

## 2019-03-31 MED ORDER — MEPERIDINE HCL 50 MG/ML IJ SOLN
INTRAMUSCULAR | Status: AC
  Filled 2019-03-31: qty 1

## 2019-03-31 NOTE — Discharge Instructions (Signed)
No aspirin or NSAIDs for 24 hours. Resume other medications as before. Modified carb high-fiber diet. No driving for 24 hours. Physician will call with biopsy results.   Upper Endoscopy, Adult, Care After This sheet gives you information about how to care for yourself after your procedure. Your health care provider may also give you more specific instructions. If you have problems or questions, contact your health care provider. What can I expect after the procedure? After the procedure, it is common to have:  A sore throat.  Mild stomach pain or discomfort.  Bloating.  Nausea. Follow these instructions at home:   Follow instructions from your health care provider about what to eat or drink after your procedure.  Return to your normal activities as told by your health care provider. Ask your health care provider what activities are safe for you.  Take over-the-counter and prescription medicines only as told by your health care provider.  Do not drive for 24 hours if you were given a sedative during your procedure.  Keep all follow-up visits as told by your health care provider. This is important. Contact a health care provider if you have:  A sore throat that lasts longer than one day.  Trouble swallowing. Get help right away if:  You vomit blood or your vomit looks like coffee grounds.  You have: ? A fever. ? Bloody, black, or tarry stools. ? A severe sore throat or you cannot swallow. ? Difficulty breathing. ? Severe pain in your chest or abdomen. Summary  After the procedure, it is common to have a sore throat, mild stomach discomfort, bloating, and nausea.  Do not drive for 24 hours if you were given a sedative during the procedure.  Follow instructions from your health care provider about what to eat or drink after your procedure.  Return to your normal activities as told by your health care provider. This information is not intended to replace advice given  to you by your health care provider. Make sure you discuss any questions you have with your health care provider. Document Released: 12/30/2011 Document Revised: 12/22/2017 Document Reviewed: 11/30/2017 Elsevier Patient Education  2020 [employer].  Colonoscopy, Adult, Care After This sheet gives you information about how to care for yourself after your procedure. Your doctor may also give you more specific instructions. If you have problems or questions, call your doctor. What can I expect after the procedure? After the procedure, it is common to have:  A small amount of blood in your poop for 24 hours.  Some gas.  Mild cramping or bloating in your belly. Follow these instructions at home: General instructions  For the first 24 hours after the procedure: ? Do not drive or use machinery. ? Do not sign important documents. ? Do not drink alcohol. ? Do your daily activities more slowly than normal. ? Eat foods that are soft and easy to digest.  Take over-the-counter or prescription medicines only as told by your doctor. To help cramping and bloating:   Try walking around.  Put heat on your belly (abdomen) as told by your doctor. Use a heat source that your doctor recommends, such as a moist heat pack or a heating pad. ? Put a towel between your skin and the heat source. ? Leave the heat on for 20-30 minutes. ? Remove the heat if your skin turns bright red. This is especially important if you cannot feel pain, heat, or cold. You can get burned. Eating and drinking  Drink enough fluid to keep your pee (urine) clear or pale yellow.  Return to your normal diet as told by your doctor. Avoid heavy or fried foods that are hard to digest.  Avoid drinking alcohol for as long as told by your doctor. Contact a doctor if:  You have blood in your poop (stool) 2-3 days after the procedure. Get help right away if:  You have more than a small amount of blood in your poop.  You see  large clumps of tissue (blood clots) in your poop.  Your belly is swollen.  You feel sick to your stomach (nauseous).  You throw up (vomit).  You have a fever.  You have belly pain that gets worse, and medicine does not help your pain. Summary  After the procedure, it is common to have a small amount of blood in your poop. You may also have mild cramping and bloating in your belly.  For the first 24 hours after the procedure, do not drive or use machinery, do not sign important documents, and do not drink alcohol.  Get help right away if you have a lot of blood in your poop, feel sick to your stomach, have a fever, or have more belly pain. This information is not intended to replace advice given to you by your health care provider. Make sure you discuss any questions you have with your health care provider. Document Released: 08/02/2010 Document Revised: 04/30/2017 Document Reviewed: 03/24/2016 Elsevier Patient Education  [place].  High-Fiber Diet Fiber, also called dietary fiber, is a type of carbohydrate that is found in fruits, vegetables, whole grains, and beans. A high-fiber diet can have many health benefits. Your health care provider may recommend a high-fiber diet to help:  Prevent constipation. Fiber can make your bowel movements more regular.  Lower your cholesterol.  Relieve the following conditions: ? Swelling of veins in the anus (hemorrhoids). ? Swelling and irritation (inflammation) of specific areas of the digestive tract (uncomplicated diverticulosis). ? A problem of the large intestine (colon) that sometimes causes pain and diarrhea (irritable bowel syndrome, IBS).  Prevent overeating as part of a weight-loss plan.  Prevent heart disease, type 2 diabetes, and certain cancers. What is my plan? The recommended daily fiber intake in grams (g) includes:  38 g for men age 86 or younger.  30 g for men over age 19.  33 g for women age 71 or  younger.  21 g for women over age 83. You can get the recommended daily intake of dietary fiber by:  Eating a variety of fruits, vegetables, grains, and beans.  Taking a fiber supplement, if it is not possible to get enough fiber through your diet. What do I need to know about a high-fiber diet?  It is better to get fiber through food sources rather than from fiber supplements. There is not a lot of research about how effective supplements are.  Always check the fiber content on the nutrition facts label of any prepackaged food. Look for foods that contain 5 g of fiber or more per serving.  Talk with a diet and nutrition specialist (dietitian) if you have questions about specific foods that are recommended or not recommended for your medical condition, especially if those foods are not listed below.  Gradually increase how much fiber you consume. If you increase your intake of dietary fiber too quickly, you may have bloating, cramping, or gas.  Drink plenty of water. Water helps you to digest fiber.  What are tips for following this plan?  Eat a wide variety of high-fiber foods.  Make sure that half of the grains that you eat each day are whole grains.  Eat breads and cereals that are made with whole-grain flour instead of refined flour or white flour.  Eat brown rice, bulgur wheat, or millet instead of white rice.  Start the day with a breakfast that is high in fiber, such as a cereal that contains 5 g of fiber or more per serving.  Use beans in place of meat in soups, salads, and pasta dishes.  Eat high-fiber snacks, such as berries, raw vegetables, nuts, and popcorn.  Choose whole fruits and vegetables instead of processed forms like juice or sauce. What foods can I eat?  Fruits Berries. Pears. Apples. Oranges. Avocado. Prunes and raisins. Dried figs. Vegetables Sweet potatoes. Spinach. Kale. Artichokes. Cabbage. Broccoli. Cauliflower. Green peas. Carrots.  Squash. Grains Whole-grain breads. Multigrain cereal. Oats and oatmeal. Brown rice. Barley. Bulgur wheat. [place]. Quinoa. Bran muffins. Popcorn. Rye wafer crackers. Meats and other proteins Navy, kidney, and pinto beans. Soybeans. Split peas. Lentils. Nuts and seeds. Dairy Fiber-fortified yogurt. Beverages Fiber-fortified soy milk. Fiber-fortified orange juice. Other foods Fiber bars. The items listed above may not be a complete list of recommended foods and beverages. Contact a dietitian for more options. What foods are not recommended? Fruits Fruit juice. Cooked, strained fruit. Vegetables Fried potatoes. Canned vegetables. Well-cooked vegetables. Grains White bread. Pasta made with refined flour. White rice. Meats and other proteins Fatty cuts of meat. Fried chicken or fried fish. Dairy Milk. Yogurt. Cream cheese. Sour cream. Fats and oils Butters. Beverages Soft drinks. Other foods Cakes and pastries. The items listed above may not be a complete list of foods and beverages to avoid. Contact a dietitian for more information. Summary  Fiber is a type of carbohydrate. It is found in fruits, vegetables, whole grains, and beans.  There are many health benefits of eating a high-fiber diet, such as preventing constipation, lowering blood cholesterol, helping with weight loss, and reducing your risk of heart disease, diabetes, and certain cancers.  Gradually increase your intake of fiber. Increasing too fast can result in cramping, bloating, and gas. Drink plenty of water while you increase your fiber.  The best sources of fiber include whole fruits and vegetables, whole grains, nuts, seeds, and beans. This information is not intended to replace advice given to you by your health care provider. Make sure you discuss any questions you have with your health care provider. Document Released: 06/30/2005 Document Revised: 05/04/2017 Document Reviewed: 05/04/2017 Elsevier Patient  Education  [place].  Colon Polyps  Polyps are tissue growths inside the body. Polyps can grow in many places, including the large intestine (colon). A polyp may be a round bump or a mushroom-shaped growth. You could have one polyp or several. Most colon polyps are noncancerous (benign). However, some colon polyps can become cancerous over time. Finding and removing the polyps early can help prevent this. What are the causes? The exact cause of colon polyps is not known. What increases the risk? You are more likely to develop this condition if you:  Have a family history of colon cancer or colon polyps.  Are older than 55 or older than 45 if you are African American.  Have inflammatory bowel disease, such as ulcerative colitis or Crohn's disease.  Have certain hereditary conditions, such as: ? Familial adenomatous polyposis. ? Lynch syndrome. ? Turcot syndrome. ? Peutz-Jeghers syndrome.  Are overweight.  Smoke cigarettes.  Do not get enough exercise.  Drink too much alcohol.  Eat a diet that is high in fat and red meat and low in fiber.  Had childhood cancer that was treated with abdominal radiation. What are the signs or symptoms? Most polyps do not cause symptoms. If you have symptoms, they may include:  Blood coming from your rectum when having a bowel movement.  Blood in your stool. The stool may look dark red or black.  Abdominal pain.  A change in bowel habits, such as constipation or diarrhea. How is this diagnosed? This condition is diagnosed with a colonoscopy. This is a procedure in which a lighted, flexible scope is inserted into the anus and then passed into the colon to examine the area. Polyps are sometimes found when a colonoscopy is done as part of routine cancer screening tests. How is this treated? Treatment for this condition involves removing any polyps that are found. Most polyps can be removed during a colonoscopy. Those polyps will then  be tested for cancer. Additional treatment may be needed depending on the results of testing. Follow these instructions at home: Lifestyle  Maintain a healthy weight, or lose weight if recommended by your health care provider.  Exercise every day or as told by your health care provider.  Do not use any products that contain nicotine or tobacco, such as cigarettes and e-cigarettes. If you need help quitting, ask your health care provider.  If you drink alcohol, limit how much you have: ? 0-1 drink a day for women. ? 0-2 drinks a day for men.  Be aware of how much alcohol is in your drink. In the [place] one drink equals one 12 oz bottle of beer (355 mL), one 5 oz glass of wine (148 mL), or one 1 oz shot of hard liquor (44 mL). Eating and drinking   Eat foods that are high in fiber, such as fruits, vegetables, and whole grains.  Eat foods that are high in calcium and vitamin D, such as milk, cheese, yogurt, eggs, liver, fish, and broccoli.  Limit foods that are high in fat, such as fried foods and desserts.  Limit the amount of red meat and processed meat you eat, such as hot dogs, sausage, bacon, and lunch meats. General instructions  Keep all follow-up visits as told by your health care provider. This is important. ? This includes having regularly scheduled colonoscopies. ? Talk to your health care provider about when you need a colonoscopy. Contact a health care provider if:  You have new or worsening bleeding during a bowel movement.  You have new or increased blood in your stool.  You have a change in bowel habits.  You lose weight for no known reason. Summary  Polyps are tissue growths inside the body. Polyps can grow in many places, including the colon.  Most colon polyps are noncancerous (benign), but some can become cancerous over time.  This condition is diagnosed with a colonoscopy.  Treatment for this condition involves removing any polyps that are found. Most  polyps can be removed during a colonoscopy. This information is not intended to replace advice given to you by your health care provider. Make sure you discuss any questions you have with your health care provider. Document Released: 03/26/2004 Document Revised: 10/15/2017 Document Reviewed: 10/15/2017 Elsevier Patient Education  2020 [employer].

## 2019-03-31 NOTE — H&P (Addendum)
Phyllis Leonard is an 63 y.o. female.   Chief Complaint: Patient is here for esophagogastroduodenoscopy and colonoscopy. HPI: Patient is 63 year old Caucasian female with history of GERD colonic polyps who is here for EGD and colonoscopy.  She states she has had GERD for few years.  She has been on omeprazole.  She is watching her diet.  She has been having episodic epigastric pain with nausea and vomiting.  She says she had 2 episodes about 3 months ago.  She has not had any more episodes.  She says after she throws up she feels better.  But she vomits his food that she had eaten that evening.  She denies dysphagia melena or rectal bleeding.  She has a history of colonic polyps. Family history is negative for CRC. She is on Celebrex 200 mg daily and low-dose aspirin.  Aspirin is on hold.  Past Medical History:  Diagnosis Date  . Arthritis    "all over"  . Dental crowns present   . Hypercholesterolemia   . Hypertension    under control with med., has been on med. x 6-8 yr.  . Immature cataract   . Insulin dependent diabetes mellitus ([place])   . 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 ABDOMINAL HYSTERECTOMY     partial  . CHOLECYSTECTOMY    . LUMBAR LAMINECTOMY     prior to 2013 surgery  . LUMBAR LAMINECTOMY/DECOMPRESSION MICRODISCECTOMY  04/29/2012   Procedure: LUMBAR LAMINECTOMY/DECOMPRESSION MICRODISCECTOMY 1 LEVEL;  Surgeon: Erline Levine, MD;  Location: [facility];  Service: Neurosurgery;  Laterality: Left;  Left Lumbar Four-Five Redo Microdiskectomy  . SHOULDER ARTHROSCOPY W/ ROTATOR CUFF REPAIR Left   . TOTAL KNEE ARTHROPLASTY Bilateral   . ULNAR SHORTENING WITH BONE GRAFT Left 01/07/2018   Procedure: Repair ULNAR styloid non-union WITH BONE GRAFT;  Surgeon: Daryll Brod, MD;  Location: [facility];  Service: Orthopedics;  Laterality: Left;  axillary block    History reviewed. No pertinent family  history. Social History:  reports that she has never smoked. She has never used smokeless tobacco. She reports that she does not drink alcohol or use drugs.  Allergies: No Known Allergies  Medications Prior to Admission  Medication Sig Dispense Refill  . acetaminophen (TYLENOL) 650 MG CR tablet Take 1,300 mg by mouth every 8 (eight) hours as needed for pain.    Marland Kitchen ARTIFICIAL TEAR SOLUTION OP Place 1 drop into both eyes daily as needed (dry eyes).    Marland Kitchen aspirin EC 81 MG tablet Take 81 mg by mouth daily.    Marland Kitchen atorvastatin (LIPITOR) 80 MG tablet Take 80 mg by mouth every evening.     . canagliflozin (INVOKANA) 300 MG TABS tablet Take 300 mg by mouth daily before breakfast.    . celecoxib (CELEBREX) 200 MG capsule Take 200 mg by mouth daily.    . Dulaglutide (TRULICITY) 1.5 0000000 SOPN Inject 1.5 mg into the skin every Saturday.     . DULoxetine (CYMBALTA) 60 MG capsule Take 60 mg by mouth every evening.     . Insulin Glargine, 2 Unit Dial, (TOUJEO MAX SOLOSTAR) 300 UNIT/ML SOPN Inject 65 Units into the skin at bedtime.    Marland Kitchen losartan (COZAAR) 25 MG tablet Take 50 mg by mouth daily.    . metFORMIN (GLUCOPHAGE-XR) 500 MG 24 hr tablet Take 1,000 mg by mouth 2 (two) times daily.    Marland Kitchen omeprazole (  PRILOSEC) 40 MG capsule Take 40 mg by mouth daily.    . Vitamin D, Ergocalciferol, (DRISDOL) 1.25 MG (50000 UT) CAPS capsule Take 50,000 Units by mouth every Saturday.      Results for orders placed or performed during the hospital encounter of 03/31/19 (from the past 48 hour(s))  Glucose, capillary     Status: Abnormal   Collection Time: 03/31/19 10:58 AM  Result Value Ref Range   Glucose-Capillary 113 (H) 70 - 99 mg/dL   No results found.  ROS  Blood pressure 140/71, pulse 89, temperature 97.6 F (36.4 C), temperature source Oral, resp. rate 12, height 5\' 4"  (1.626 m), weight 87.5 kg, SpO2 99 %. Physical Exam  Constitutional: She appears well-nourished.  HENT:  Mouth/Throat: Oropharynx is clear  and moist.  Eyes: Conjunctivae are normal. No scleral icterus.  Neck: No thyromegaly present.  Cardiovascular: Normal rate, regular rhythm and normal heart sounds.  No murmur heard. Respiratory: Effort normal and breath sounds normal.  GI: Soft. She exhibits no distension and no mass. There is no abdominal tenderness.  Musculoskeletal:        General: No edema.  Lymphadenopathy:    She has no cervical adenopathy.  Neurological: She is alert.  Skin: Skin is warm and dry.     Assessment/Plan Chronic GERD.  Episodic epigastric pain with nausea and vomiting. History of colonic polyps. Diagnostic EGD and surveillance colonoscopy.  Hildred Laser, MD 03/31/2019, 11:51 AM

## 2019-03-31 NOTE — Op Note (Signed)
[facility] Patient Name: Phyllis Leonard Procedure Date: 03/31/2019 11:41 AM MRN: [id number] Date of Birth: 24-Jan-1956 Attending MD: Hildred Laser , MD CSN: [id number] Age: 63 Admit Type: Outpatient Procedure:                Upper GI endoscopy Indications:              Epigastric abdominal pain, Follow-up of                            gastro-esophageal reflux disease Providers:                Hildred Laser, MD, Janeece Riggers, RN, Raphael Gibney,                            Technician Referring MD:             Moshe Cipro, MD Medicines:                Lidocaine spray, Meperidine 50 mg IV, Midazolam 6                            mg IV Complications:            No immediate complications. Estimated Blood Loss:     Estimated blood loss was minimal. Procedure:                Pre-Anesthesia Assessment:                           - Prior to the procedure, a History and Physical                            was performed, and patient medications and                            allergies were reviewed. The patient's tolerance of                            previous anesthesia was also reviewed. The risks                            and benefits of the procedure and the sedation                            options and risks were discussed with the patient.                            All questions were answered, and informed consent                            was obtained. Prior Anticoagulants: The patient has                            taken no previous anticoagulant or antiplatelet  agents except for aspirin and has taken no previous                            anticoagulant or antiplatelet agents except for                            NSAID medication. ASA Grade Assessment: II - A                            patient with mild systemic disease. After reviewing                            the risks and benefits, the patient was deemed in                            satisfactory  condition to undergo the procedure.                           After obtaining informed consent, the endoscope was                            passed under direct vision. Throughout the                            procedure, the patient's blood pressure, pulse, and                            oxygen saturations were monitored continuously. The                            GIF-H190 [id number] was introduced through the                            mouth, and advanced to the second part of duodenum.                            The upper GI endoscopy was accomplished without                            difficulty. The patient tolerated the procedure                            well. Scope In: 12:01:08 PM Scope Out: 12:11:12 PM Total Procedure Duration: 0 hours 10 minutes 4 seconds  Findings:      The examined esophagus was normal.      The Z-line was regular and was found 40 cm from the incisors.      Patchy mild inflammation characterized by congestion (edema), erythema       and granularity was found in the gastric antrum. Biopsies were taken       with a cold forceps for histology. The pathology specimen was placed       into Bottle Number 2.      The exam of the stomach was otherwise normal.      The  duodenal bulb was normal.      Mild mucosal changes characterized by altered texture were found in the       second portion of the duodenum. Biopsies were taken with a cold forceps       for histology. The pathology specimen was placed into Bottle Number 1. Impression:               - Normal esophagus.                           - Z-line regular, 40 cm from the incisors.                           - Gastritis. Biopsied.                           - Normal duodenal bulb.                           - Mucosal changes in the duodenum. Biopsied. Moderate Sedation:      Moderate (conscious) sedation was administered by the endoscopy nurse       and supervised by the endoscopist. The following parameters were        monitored: oxygen saturation, heart rate, blood pressure, CO2       capnography and response to care. Total physician intraservice time was       13 minutes. Recommendation:           - Patient has a contact number available for                            emergencies. The signs and symptoms of potential                            delayed complications were discussed with the                            patient. Return to normal activities tomorrow.                            Written discharge instructions were provided to the                            patient.                           - Continue present medications.                           - Famotidine 20 mg po qhs prn.                           - Await pathology results.                           - See the other procedure note for documentation of  additional recommendations. Procedure Code(s):        --- Professional ---                           [phone], Esophagogastroduodenoscopy, flexible,                            transoral; with biopsy, single or multiple                           G0500, Moderate sedation services provided by the                            same physician or other qualified health care                            professional performing a gastrointestinal                            endoscopic service that sedation supports,                            requiring the presence of an independent trained                            observer to assist in the monitoring of the                            patient's level of consciousness and physiological                            status; initial 15 minutes of intra-service time;                            patient age 7 years or older (additional time may                            be reported with [phone], as appropriate) Diagnosis Code(s):        --- Professional ---                           K29.70, Gastritis, unspecified, without bleeding                            K31.89, Other diseases of stomach and duodenum                           R10.13, Epigastric pain                           K21.9, Gastro-esophageal reflux disease without                            esophagitis CPT copyright 2019 American [employer] Association. All rights reserved. The codes documented in this report are preliminary and upon coder review may  be  revised to meet current compliance requirements. Hildred Laser, MD Hildred Laser, MD 03/31/2019 12:57:21 PM This report has been signed electronically. Number of Addenda: 0

## 2019-03-31 NOTE — Op Note (Signed)
[facility] Patient Name: Phyllis Leonard Procedure Date: 03/31/2019 12:11 PM MRN: [id number] Date of Birth: June 14, 1956 Attending MD: Hildred Laser , MD CSN: [id number] Age: 63 Admit Type: Outpatient Procedure:                Colonoscopy Indications:              High risk colon cancer surveillance: Personal                            history of colonic polyps Providers:                Hildred Laser, MD, Janeece Riggers, RN, Raphael Gibney,                            Technician Referring MD:             Moshe Cipro, MD Medicines:                Midazolam 2 mg IV Complications:            No immediate complications. Estimated Blood Loss:     Estimated blood loss was minimal. Procedure:                Pre-Anesthesia Assessment:                           - Prior to the procedure, a History and Physical                            was performed, and patient medications and                            allergies were reviewed. The patient's tolerance of                            previous anesthesia was also reviewed. The risks                            and benefits of the procedure and the sedation                            options and risks were discussed with the patient.                            All questions were answered, and informed consent                            was obtained. Prior Anticoagulants: The patient has                            taken no previous anticoagulant or antiplatelet                            agents except for aspirin and has taken no previous  anticoagulant or antiplatelet agents except for                            NSAID medication. ASA Grade Assessment: II - A                            patient with mild systemic disease. After reviewing                            the risks and benefits, the patient was deemed in                            satisfactory condition to undergo the procedure.                           After  obtaining informed consent, the colonoscope                            was passed under direct vision. Throughout the                            procedure, the patient's blood pressure, pulse, and                            oxygen saturations were monitored continuously. The                            [id number]-[id number] scope was introduced through                            the anus and advanced to the the cecum, identified                            by appendiceal orifice and ileocecal valve. The                            patient tolerated the procedure well. The quality                            of the bowel preparation was adequate. The                            ileocecal valve, appendiceal orifice, and rectum                            were photographed. Scope In: 12:13:22 PM Scope Out: 12:43:51 PM Scope Withdrawal Time: 0 hours 17 minutes 22 seconds  Total Procedure Duration: 0 hours 30 minutes 29 seconds  Findings:      The perianal and digital rectal examinations were normal.      Two polyps were found in the proximal sigmoid colon and ascending colon.       The polyps were small in size. These polyps were removed with a cold       snare. Resection was complete, but  the polyp tissue was only partially       retrieved. The pathology specimen was placed into Bottle Number 1.      A small polyp was found in the rectum. Biopsies were taken with a cold       forceps for histology. The pathology specimen was placed into Bottle       Number 1.      A single small-mouthed diverticulum was found in the hepatic flexure.      Multiple medium-mouthed diverticula were found in the sigmoid colon.      The retroflexed view of the distal rectum and anal verge was normal and       showed no anal or rectal abnormalities. Impression:               - Two small polyps in the proximal sigmoid colon                            and in the ascending colon, removed with a cold                             snare. Complete resection. Partial retrieval.                           - One small polyp in the rectum. Biopsied.                           - Diverticulosis at the hepatic flexure.                           - Diverticulosis in the sigmoid colon. Moderate Sedation:      Moderate (conscious) sedation was administered by the endoscopy nurse       and supervised by the endoscopist. The following parameters were       monitored: oxygen saturation, heart rate, blood pressure, CO2       capnography and response to care. Total physician intraservice time was       28 minutes. Recommendation:           - Patient has a contact number available for                            emergencies. The signs and symptoms of potential                            delayed complications were discussed with the                            patient. Return to normal activities tomorrow.                            Written discharge instructions were provided to the                            patient.                           - High fiber diet and diabetic (ADA) diet today.                           -  Continue present medications.                           - No aspirin, ibuprofen, naproxen, or other                            non-steroidal anti-inflammatory drugs for 1 day.                           - Await pathology results.                           - Repeat colonoscopy is recommended. The                            colonoscopy date will be determined after pathology                            results from today's exam become available for                            review. Procedure Code(s):        --- Professional ---                           [phone], Colonoscopy, flexible; with removal of                            tumor(s), polyp(s), or other lesion(s) by snare                            technique                           45380, 59, Colonoscopy, flexible; with biopsy,                            single or  multiple                           99153, Moderate sedation; each additional 15                            minutes intraservice time                           G0500, Moderate sedation services provided by the                            same physician or other qualified health care                            professional performing a gastrointestinal                            endoscopic service that sedation supports,  requiring the presence of an independent trained                            observer to assist in the monitoring of the                            patient's level of consciousness and physiological                            status; initial 15 minutes of intra-service time;                            patient age 30 years or older (additional time may                            be reported with [phone], as appropriate) Diagnosis Code(s):        --- Professional ---                           Z86.010, Personal history of colonic polyps                           K63.5, Polyp of colon                           K62.1, Rectal polyp                           K57.30, Diverticulosis of large intestine without                            perforation or abscess without bleeding CPT copyright 2019 American Medical Association. All rights reserved. The codes documented in this report are preliminary and upon coder review may  be revised to meet current compliance requirements. Hildred Laser, MD Hildred Laser, MD 03/31/2019 1:04:03 PM This report has been signed electronically. Number of Addenda: 0

## 2019-04-01 ENCOUNTER — Other Ambulatory Visit (INDEPENDENT_AMBULATORY_CARE_PROVIDER_SITE_OTHER): Payer: Self-pay | Admitting: Internal Medicine

## 2019-04-01 LAB — SURGICAL PATHOLOGY

## 2019-04-04 ENCOUNTER — Encounter (HOSPITAL_COMMUNITY): Payer: Self-pay | Admitting: Internal Medicine

## 2019-11-09 ENCOUNTER — Other Ambulatory Visit (INDEPENDENT_AMBULATORY_CARE_PROVIDER_SITE_OTHER): Payer: Self-pay | Admitting: *Deleted

## 2022-11-18 ENCOUNTER — Ambulatory Visit (HOSPITAL_COMMUNITY): Payer: Self-pay | Admitting: Emergency Medicine

## 2022-11-18 DIAGNOSIS — G8929 Other chronic pain: Secondary | ICD-10-CM

## 2022-11-18 NOTE — H&P (Signed)
TOTAL KNEE REVISION ADMISSION H&P  Patient is being admitted for right revision total knee arthroplasty.  Subjective:  Chief Complaint:right knee pain.  HPI: Phyllis Leonard, 67 y.o. female, has a history of pain and functional disability in the right knee(s) due to arthritis and patient has failed non-surgical conservative treatments for greater than 12 weeks to include NSAID's and/or analgesics, corticosteriod injections, supervised PT with diminished ADL's post treatment, use of assistive devices, and activity modification. The indications for the revision of the total knee arthroplasty are loosening of one or more components. Onset of symptoms was gradual starting 2 years ago with gradually worsening course since that time.  Prior procedures on the right knee(s) include arthroplasty.  Patient currently rates pain in the right knee(s) at 10 out of 10 with activity. There is night pain, worsening of pain with activity and weight bearing, pain that interferes with activities of daily living, and pain with passive range of motion.  Patient has evidence of significant collapse of tibial base plate, with femoral components well fixed by imaging studies. This condition presents safety issues increasing the risk of falls.  There is no current active infection.  Patient Active Problem List   Diagnosis Date Noted   GERD (gastroesophageal reflux disease) 02/02/2019   History of colonic polyps 02/02/2019   Gastroesophageal reflux disease 02/02/2019   Hx of colonic polyps 02/02/2019   Past Medical History:  Diagnosis Date   Arthritis    "all over"   Dental crowns present    Hypercholesterolemia    Hypertension    under control with med., has been on med. x 6-8 yr.   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    Past Surgical History:  Procedure Laterality Date   ABDOMINAL HYSTERECTOMY     partial   BIOPSY  03/31/2019    Procedure: BIOPSY;  Surgeon: Malissa Hippo, MD;  Location: [facility];  Service: Endoscopy;;  duodenum gastric   CHOLECYSTECTOMY     COLONOSCOPY N/A 03/31/2019   Procedure: COLONOSCOPY;  Surgeon: Malissa Hippo, MD;  Location: [facility];  Service: Endoscopy;  Laterality: N/A;   ESOPHAGOGASTRODUODENOSCOPY N/A 03/31/2019   Procedure: ESOPHAGOGASTRODUODENOSCOPY (EGD);  Surgeon: Malissa Hippo, MD;  Location: [facility];  Service: Endoscopy;  Laterality: N/A;  200-office moved to 12:00pm   LUMBAR LAMINECTOMY     prior to 2013 surgery   LUMBAR LAMINECTOMY/DECOMPRESSION MICRODISCECTOMY  04/29/2012   Procedure: LUMBAR LAMINECTOMY/DECOMPRESSION MICRODISCECTOMY 1 LEVEL;  Surgeon: Maeola Harman, MD;  Location: [facility];  Service: Neurosurgery;  Laterality: Left;  Left Lumbar Four-Five Redo Microdiskectomy   POLYPECTOMY  03/31/2019   Procedure: POLYPECTOMY;  Surgeon: Malissa Hippo, MD;  Location: [facility];  Service: Endoscopy;;   SHOULDER ARTHROSCOPY W/ ROTATOR CUFF REPAIR Left    TOTAL KNEE ARTHROPLASTY Bilateral    ULNAR SHORTENING WITH BONE GRAFT Left 01/07/2018   Procedure: Repair ULNAR styloid non-union WITH BONE GRAFT;  Surgeon: Cindee Salt, MD;  Location: [facility];  Service: Orthopedics;  Laterality: Left;  axillary block    Current Outpatient Medications  Medication Sig Dispense Refill Last Dose   acetaminophen (TYLENOL) 650 MG CR tablet Take 1,300 mg by mouth every 8 (eight) hours as needed for pain.      ARTIFICIAL TEAR SOLUTION OP Place 1 drop into both eyes daily as needed (dry eyes).  aspirin EC 81 MG tablet Take 1 tablet (81 mg total) by mouth daily.      atorvastatin (LIPITOR) 80 MG tablet Take 80 mg by mouth every evening.       canagliflozin (INVOKANA) 300 MG TABS tablet Take 300 mg by mouth daily before breakfast.      celecoxib (CELEBREX) 200 MG capsule Take 1 capsule (200 mg total) by mouth daily.      Dulaglutide (TRULICITY) 1.5  MG/0.5ML SOPN Inject 1.5 mg into the skin every Saturday.       DULoxetine (CYMBALTA) 60 MG capsule Take 60 mg by mouth every evening.       famotidine (PEPCID) 20 MG tablet Take 1 tablet (20 mg total) by mouth at bedtime as needed for heartburn or indigestion.      Insulin Glargine, 2 Unit Dial, (TOUJEO MAX SOLOSTAR) 300 UNIT/ML SOPN Inject 65 Units into the skin at bedtime.      losartan (COZAAR) 25 MG tablet Take 50 mg by mouth daily.      metFORMIN (GLUCOPHAGE-XR) 500 MG 24 hr tablet Take 1,000 mg by mouth 2 (two) times daily.      omeprazole (PRILOSEC) 40 MG capsule Take 40 mg by mouth daily.      Vitamin D, Ergocalciferol, (DRISDOL) 1.25 MG (50000 UT) CAPS capsule Take 50,000 Units by mouth every Saturday.      No current facility-administered medications for this visit.   No Known Allergies  Social History   Tobacco Use   Smoking status: Never   Smokeless tobacco: Never  Substance Use Topics   Alcohol use: No    No family history on file.    Review of Systems  Musculoskeletal:  Positive for arthralgias.  All other systems reviewed and are negative.    Objective:  Physical Exam Constitutional:      General: She is not in acute distress.    Appearance: Normal appearance. She is not ill-appearing.  HENT:     Head: Normocephalic and atraumatic.     Right Ear: External ear normal.     Left Ear: External ear normal.     Nose: Nose normal.     Mouth/Throat:     Mouth: Mucous membranes are moist.     Pharynx: Oropharynx is clear.  Eyes:     Extraocular Movements: Extraocular movements intact.     Conjunctiva/sclera: Conjunctivae normal.  Cardiovascular:     Rate and Rhythm: Normal rate and regular rhythm.     Pulses: Normal pulses.     Heart sounds: Normal heart sounds.  Pulmonary:     Effort: Pulmonary effort is normal.     Breath sounds: Normal breath sounds.  Abdominal:     General: Bowel sounds are normal.     Palpations: Abdomen is soft.     Tenderness: There  is no abdominal tenderness.  Musculoskeletal:        General: Tenderness present.     Cervical back: Normal range of motion and neck supple.     Comments: ROM 5-115 of Right knee.  Mild distal medial and lateral joint line tenderness, with some tenderness elicited of posterior aspect of patella, right LE.  Mildly antalgic gait with cane.  Mild well-healing 0.5cm abrasion near the vertical well healed scar from prior right TKA without erythema or drainage.  BLE appear grossly neurovascularly intact.  Skin:    General: Skin is warm and dry.  Neurological:     Mental Status: She is alert and oriented to  person, place, and time. Mental status is at baseline.  Psychiatric:        Mood and Affect: Mood normal.        Behavior: Behavior normal.     Vital signs in last 24 hours: @VSRANGES @  Labs:  Estimated body mass index is 33.13 kg/m as calculated from the following:   Height as of 03/31/19: 5\' 4"  (1.626 m).   Weight as of 03/31/19: 87.5 kg.  Imaging Review Plain radiographs demonstrate evidence of loosening of the tibial components. The bone quality appears to be good for age and reported activity level. The femoral components appear to remain well fixed.   Assessment/Plan:  End stage arthritis, right knee(s) with failed previous arthroplasty.   The patient history, physical examination, clinical judgment of the provider and imaging studies are consistent with end stage degenerative joint disease of the right knee(s), previous total knee arthroplasty. Revision total knee arthroplasty is deemed medically necessary. The treatment options including medical management, injection therapy, arthroscopy and revision arthroplasty were discussed at length. The risks and benefits of revision total knee arthroplasty were presented and reviewed. The risks due to aseptic loosening, infection, stiffness, patella tracking problems, thromboembolic complications and other imponderables were discussed. The  patient acknowledged the explanation, agreed to proceed with the plan and consent was signed. Patient is being admitted for inpatient treatment for surgery, pain control, PT, OT, prophylactic antibiotics, VTE prophylaxis, progressive ambulation and ADL's and discharge planning. The patient is planning to be admitted to inpatient, with eventual discharge with outpatient PT.

## 2022-11-27 NOTE — Patient Instructions (Signed)
DUE TO COVID-19 ONLY TWO VISITORS  (aged 67 and older)  ARE ALLOWED TO COME WITH YOU AND STAY IN THE WAITING ROOM ONLY DURING PRE OP AND PROCEDURE.   **NO VISITORS ARE ALLOWED IN THE SHORT STAY AREA OR RECOVERY ROOM!!**  IF YOU WILL BE ADMITTED INTO THE HOSPITAL YOU ARE ALLOWED ONLY FOUR SUPPORT PEOPLE DURING VISITATION HOURS ONLY (7 AM -8PM)   The support person(s) must pass our screening, gel in and out, and wear a mask at all times, including in the patient's room. Patients must also wear a mask when staff or their support person are in the room. Visitors GUEST BADGE MUST BE WORN VISIBLY  One adult visitor may remain with you overnight and MUST be in the room by 8 P.M.     Your procedure is scheduled on: 12/15/22   Report to [facility] Main Entrance    Report to admitting at : 10:45 AM   Call this number if you have problems the morning of surgery [phone]   Do not eat food :After Midnight.   After Midnight you may have the following liquids until : 10:00 AM DAY OF SURGERY  Water Black Coffee (sugar ok, NO MILK/CREAM OR CREAMERS)  Tea (sugar ok, NO MILK/CREAM OR CREAMERS) regular and decaf                             Plain Jell-O (NO RED)                                           Fruit ices (not with fruit pulp, NO RED)                                     Popsicles (NO RED)                                                                  Juice: apple, WHITE grape, WHITE cranberry Sports drinks like Gatorade (NO RED)   The day of surgery:  Drink ONE (1) Pre-Surgery Clear G2 at: 10:00 AM the morning of surgery. Drink in one sitting. Do not sip.  This drink was given to you during your hospital  pre-op appointment visit. Nothing else to drink after completing the  Pre-Surgery Clear Ensure or G2.          If you have questions, please contact your surgeon's office.  Oral Hygiene is also important to reduce your risk of infection.                                     Remember - BRUSH YOUR TEETH THE MORNING OF SURGERY WITH YOUR REGULAR TOOTHPASTE  DENTURES WILL BE REMOVED PRIOR TO SURGERY PLEASE DO NOT APPLY "Poly grip" OR ADHESIVES!!!   Do NOT smoke after Midnight   Take these medicines the morning of surgery with A SIP OF WATER: omeprazole.Tylenol as needed. How to Manage Your Diabetes Before and  After Surgery  Why is it important to control my blood sugar before and after surgery? Improving blood sugar levels before and after surgery helps healing and can limit problems. A way of improving blood sugar control is eating a healthy diet by:  Eating less sugar and carbohydrates  Increasing activity/exercise  Talking with your doctor about reaching your blood sugar goals High blood sugars (greater than 180 mg/dL) can raise your risk of infections and slow your recovery, so you will need to focus on controlling your diabetes during the weeks before surgery. Make sure that the doctor who takes care of your diabetes knows about your planned surgery including the date and location.  How do I manage my blood sugar before surgery? Check your blood sugar at least 4 times a day, starting 2 days before surgery, to make sure that the level is not too high or low. Check your blood sugar the morning of your surgery when you wake up and every 2 hours until you get to the Short Stay unit. If your blood sugar is less than 70 mg/dL, you will need to treat for low blood sugar: Do not take insulin. Treat a low blood sugar (less than 70 mg/dL) with  cup of clear juice (cranberry or apple), 4 glucose tablets, OR glucose gel. Recheck blood sugar in 15 minutes after treatment (to make sure it is greater than 70 mg/dL). If your blood sugar is not greater than 70 mg/dL on recheck, call [phone] for further instructions. Report your blood sugar to the short stay nurse when you get to Short Stay.  If you are admitted to the hospital after surgery: Your blood sugar will be  checked by the staff and you will probably be given insulin after surgery (instead of oral diabetes medicines) to make sure you have good blood sugar levels. The goal for blood sugar control after surgery is 80-180 mg/dL.   WHAT DO I DO ABOUT MY DIABETES MEDICATION?  Hold invokana 3 days before surgery. Last dose: 12/11/22  THE NIGHT BEFORE SURGERY, take ONLY half of glargine insulin dose.     THE MORNING OF SURGERY, DO NOT TAKE Metformin.  DO NOT TAKE THE FOLLOWING 7 DAYS PRIOR TO SURGERY: Ozempic, Wegovy, Rybelsus (Semaglutide), Byetta (exenatide), Bydureon (exenatide ER), Victoza, Saxenda (liraglutide), or Trulicity (dulaglutide) Mounjaro (Tirzepatide) Adlyxin (Lixisenatide), Polyethylene Glycol Loxenatide. Last dose 12/06/22                              You may not have any metal on your body including hair pins, jewelry, and body piercing             Do not wear make-up, lotions, powders, perfumes/cologne, or deodorant  Do not wear nail polish including gel and S&S, artificial/acrylic nails, or any other type of covering on natural nails including finger and toenails. If you have artificial nails, gel coating, etc. that needs to be removed by a nail salon please have this removed prior to surgery or surgery may need to be canceled/ delayed if the surgeon/ anesthesia feels like they are unable to be safely monitored.   Do not shave  48 hours prior to surgery.    Do not bring valuables to the hospital. [facility] IS NOT             RESPONSIBLE   FOR VALUABLES.   Contacts, glasses, or bridgework may not be worn into surgery.   Bring  small overnight bag day of surgery.   DO NOT BRING YOUR HOME MEDICATIONS TO THE HOSPITAL. PHARMACY WILL DISPENSE MEDICATIONS LISTED ON YOUR MEDICATION LIST TO YOU DURING YOUR ADMISSION IN THE HOSPITAL!    Patients discharged on the day of surgery will not be allowed to drive home.  Someone NEEDS to stay with you for the first 24 hours after  anesthesia.   Special Instructions: Bring a copy of your healthcare power of attorney and living will documents         the day of surgery if you haven't scanned them before.              Please read over the following fact sheets you were given: IF YOU HAVE QUESTIONS ABOUT YOUR PRE-OP INSTRUCTIONS PLEASE CALL [phone]      Pre-operative 5 CHG Bath Instructions   You can play a key role in reducing the risk of infection after surgery. Your skin needs to be as free of germs as possible. You can reduce the number of germs on your skin by washing with CHG (chlorhexidine gluconate) soap before surgery. CHG is an antiseptic soap that kills germs and continues to kill germs even after washing.   DO NOT use if you have an allergy to chlorhexidine/CHG or antibacterial soaps. If your skin becomes reddened or irritated, stop using the CHG and notify one of our RNs at: [phone].   Please shower with the CHG soap starting 4 days before surgery using the following schedule:     Please keep in mind the following:  DO NOT shave, including legs and underarms, starting the day of your first shower.   You may shave your face at any point before/day of surgery.  Place clean sheets on your bed the day you start using CHG soap. Use a clean washcloth (not used since being washed) for each shower. DO NOT sleep with pets once you start using the CHG.   CHG Shower Instructions:  If you choose to wash your hair and private area, wash first with your normal shampoo/soap.  After you use shampoo/soap, rinse your hair and body thoroughly to remove shampoo/soap residue.  Turn the water OFF and apply about 3 tablespoons (45 ml) of CHG soap to a CLEAN washcloth.  Apply CHG soap ONLY FROM YOUR NECK DOWN TO YOUR TOES (washing for 3-5 minutes)  DO NOT use CHG soap on face, private areas, open wounds, or sores.  Pay special attention to the area where your surgery is being performed.  If you are having back  surgery, having someone wash your back for you may be helpful. Wait 2 minutes after CHG soap is applied, then you may rinse off the CHG soap.  Pat dry with a clean towel  Put on clean clothes/pajamas   If you choose to wear lotion, please use ONLY the CHG-compatible lotions on the back of this paper.     Additional instructions for the day of surgery: DO NOT APPLY any lotions, deodorants, cologne, or perfumes.   Put on clean/comfortable clothes.  Brush your teeth.  Ask your nurse before applying any prescription medications to the skin.      CHG Compatible Lotions   Aveeno Moisturizing lotion  Cetaphil Moisturizing Cream  Cetaphil Moisturizing Lotion  Clairol Herbal Essence Moisturizing Lotion, Dry Skin  Clairol Herbal Essence Moisturizing Lotion, Extra Dry Skin  Clairol Herbal Essence Moisturizing Lotion, Normal Skin  Curel Age Defying Therapeutic Moisturizing Lotion with Alpha Hydroxy  Curel  Extreme Care Body Lotion  Curel Soothing Hands Moisturizing Hand Lotion  Curel Therapeutic Moisturizing Cream, Fragrance-Free  Curel Therapeutic Moisturizing Lotion, Fragrance-Free  Curel Therapeutic Moisturizing Lotion, Original Formula  Eucerin Daily Replenishing Lotion  Eucerin Dry Skin Therapy Plus Alpha Hydroxy Crme  Eucerin Dry Skin Therapy Plus Alpha Hydroxy Lotion  Eucerin Original Crme  Eucerin Original Lotion  Eucerin Plus Crme Eucerin Plus Lotion  Eucerin TriLipid Replenishing Lotion  Keri Anti-Bacterial Hand Lotion  Keri Deep Conditioning Original Lotion Dry Skin Formula Softly Scented  Keri Deep Conditioning Original Lotion, Fragrance Free Sensitive Skin Formula  Keri Lotion Fast Absorbing Fragrance Free Sensitive Skin Formula  Keri Lotion Fast Absorbing Softly Scented Dry Skin Formula  Keri Original Lotion  Keri Skin Renewal Lotion Keri Silky Smooth Lotion  Keri Silky Smooth Sensitive Skin Lotion  Nivea Body Creamy Conditioning Oil  Nivea Body Extra Enriched  Lotion  Nivea Body Original Lotion  Nivea Body Sheer Moisturizing Lotion Nivea Crme  Nivea Skin Firming Lotion  NutraDerm 30 Skin Lotion  NutraDerm Skin Lotion  NutraDerm Therapeutic Skin Cream  NutraDerm Therapeutic Skin Lotion  ProShield Protective Hand Cream  Provon moisturizing lotion   Incentive Spirometer  An incentive spirometer is a tool that can help keep your lungs clear and active. This tool measures how well you are filling your lungs with each breath. Taking long deep breaths may help reverse or decrease the chance of developing breathing (pulmonary) problems (especially infection) following: A long period of time when you are unable to move or be active. BEFORE THE PROCEDURE  If the spirometer includes an indicator to show your best effort, your nurse or respiratory therapist will set it to a desired goal. If possible, sit up straight or lean slightly forward. Try not to slouch. Hold the incentive spirometer in an upright position. INSTRUCTIONS FOR USE  Sit on the edge of your bed if possible, or sit up as far as you can in bed or on a chair. Hold the incentive spirometer in an upright position. Breathe out normally. Place the mouthpiece in your mouth and seal your lips tightly around it. Breathe in slowly and as deeply as possible, raising the piston or the ball toward the top of the column. Hold your breath for 3-5 seconds or for as long as possible. Allow the piston or ball to fall to the bottom of the column. Remove the mouthpiece from your mouth and breathe out normally. Rest for a few seconds and repeat Steps 1 through 7 at least 10 times every 1-2 hours when you are awake. Take your time and take a few normal breaths between deep breaths. The spirometer may include an indicator to show your best effort. Use the indicator as a goal to work toward during each repetition. After each set of 10 deep breaths, practice coughing to be sure your lungs are clear. If you have  an incision (the cut made at the time of surgery), support your incision when coughing by placing a pillow or rolled up towels firmly against it. Once you are able to get out of bed, walk around indoors and cough well. You may stop using the incentive spirometer when instructed by your caregiver.  RISKS AND COMPLICATIONS Take your time so you do not get dizzy or light-headed. If you are in pain, you may need to take or ask for pain medication before doing incentive spirometry. It is harder to take a deep breath if you are having pain. AFTER USE Rest and  breathe slowly and easily. It can be helpful to keep track of a log of your progress. Your caregiver can provide you with a simple table to help with this. If you are using the spirometer at home, follow these instructions: SEEK MEDICAL CARE IF:  You are having difficultly using the spirometer. You have trouble using the spirometer as often as instructed. Your pain medication is not giving enough relief while using the spirometer. You develop fever of 100.5 F (38.1 C) or higher. SEEK IMMEDIATE MEDICAL CARE IF:  You cough up bloody sputum that had not been present before. You develop fever of 102 F (38.9 C) or greater. You develop worsening pain at or near the incision site. MAKE SURE YOU:  Understand these instructions. Will watch your condition. Will get help right away if you are not doing well or get worse. Document Released: 11/10/2006 Document Revised: 09/22/2011 Document Reviewed: 01/11/2007 [facility] Patient Information 2014 [place], [place].   ________________________________________________________________________

## 2022-12-02 ENCOUNTER — Encounter (HOSPITAL_COMMUNITY)
Admission: RE | Admit: 2022-12-02 | Discharge: 2022-12-02 | Disposition: A | Discharge status: Home / Self Care | Payer: Medicare Other | Source: Ambulatory Visit | Attending: Orthopedic Surgery | Admitting: Orthopedic Surgery

## 2022-12-02 ENCOUNTER — Other Ambulatory Visit: Payer: Self-pay

## 2022-12-02 ENCOUNTER — Encounter (HOSPITAL_COMMUNITY): Payer: Self-pay

## 2022-12-02 VITALS — BP 133/77 | HR 80 | Temp 97.5°F | Resp 18 | Ht 64.0 in | Wt 222.0 lb

## 2022-12-02 DIAGNOSIS — I2510 Atherosclerotic heart disease of native coronary artery without angina pectoris: Secondary | ICD-10-CM | POA: Diagnosis not present

## 2022-12-02 DIAGNOSIS — E119 Type 2 diabetes mellitus without complications: Secondary | ICD-10-CM

## 2022-12-02 DIAGNOSIS — Z794 Long term (current) use of insulin: Secondary | ICD-10-CM | POA: Diagnosis not present

## 2022-12-02 DIAGNOSIS — G8929 Other chronic pain: Secondary | ICD-10-CM

## 2022-12-02 DIAGNOSIS — Z01818 Encounter for other preprocedural examination: Secondary | ICD-10-CM

## 2022-12-02 DIAGNOSIS — M25561 Pain in right knee: Secondary | ICD-10-CM | POA: Diagnosis not present

## 2022-12-02 LAB — COMPREHENSIVE METABOLIC PANEL
ALT: 19 U/L (ref 0–44)
AST: 22 U/L (ref 15–41)
Albumin: 3.9 g/dL (ref 3.5–5.0)
Alkaline Phosphatase: 61 U/L (ref 38–126)
Anion gap: 9 (ref 5–15)
BUN: 25 mg/dL — ABNORMAL HIGH (ref 8–23)
CO2: 23 mmol/L (ref 22–32)
Calcium: 9.3 mg/dL (ref 8.9–10.3)
Chloride: 107 mmol/L (ref 98–111)
Creatinine, Ser: 1.02 mg/dL — ABNORMAL HIGH (ref 0.44–1.00)
GFR, Estimated: >60 mL/min (ref >60)
Glucose, Bld: 121 mg/dL — ABNORMAL HIGH (ref 70–99)
Potassium: 4.7 mmol/L (ref 3.5–5.1)
Sodium: 139 mmol/L (ref 135–145)
Total Bilirubin: 1.5 mg/dL — ABNORMAL HIGH (ref 0.3–1.2)
Total Protein: 6.9 g/dL (ref 6.5–8.1)

## 2022-12-02 LAB — CBC WITH DIFFERENTIAL/PLATELET
Abs Immature Granulocytes: 0.05 10*3/uL (ref 0.00–0.07)
Basophils Absolute: 0.1 10*3/uL (ref 0.0–0.1)
Basophils Relative: 1 %
Eosinophils Absolute: 0.4 10*3/uL (ref 0.0–0.5)
Eosinophils Relative: 5 %
HCT: 41.9 % (ref 36.0–46.0)
Hemoglobin: 13.4 g/dL (ref 12.0–15.0)
Immature Granulocytes: 1 %
Lymphocytes Relative: 19 %
Lymphs Abs: 1.6 10*3/uL (ref 0.7–4.0)
MCH: 29.7 pg (ref 26.0–34.0)
MCHC: 32 g/dL (ref 30.0–36.0)
MCV: 92.9 fL (ref 80.0–100.0)
Monocytes Absolute: 0.6 10*3/uL (ref 0.1–1.0)
Monocytes Relative: 7 %
Neutro Abs: 5.8 10*3/uL (ref 1.7–7.7)
Neutrophils Relative %: 67 %
Platelets: 199 10*3/uL (ref 150–400)
RBC: 4.51 MIL/uL (ref 3.87–5.11)
RDW: 14 % (ref 11.5–15.5)
WBC: 8.5 10*3/uL (ref 4.0–10.5)
nRBC: 0 % (ref 0.0–0.2)

## 2022-12-02 LAB — TYPE AND SCREEN
ABO/RH(D): O POS
Antibody Screen: NEGATIVE

## 2022-12-02 LAB — GLUCOSE, CAPILLARY: Glucose-Capillary: 134 mg/dL — ABNORMAL HIGH (ref 70–99)

## 2022-12-02 LAB — SURGICAL PCR SCREEN
MRSA, PCR: NEGATIVE
Staphylococcus aureus: POSITIVE — AB

## 2022-12-02 NOTE — Progress Notes (Signed)
PCR: + STAPH °

## 2022-12-02 NOTE — Progress Notes (Addendum)
For Short Stay: COVID SWAB appointment date:  Bowel Prep reminder:   For Anesthesia: PCP - Dr. Romeo Rabon. LOV: 11/07/22 Cardiologist - Dr. Laural Golden. : Chart.  Chest x-ray -  EKG - 07/30/22: Chart. 11/07/22: requested. Stress Test -  ECHO -  Cardiac Cath -  Pacemaker/ICD device last checked: Pacemaker orders received: Device Rep notified:  Spinal Cord Stimulator: N/A  Sleep Study - N/A CPAP -   Fasting Blood Sugar - 100's Checks Blood Sugar _____ ti3mes a day Date and result of last Hgb A1c-6.4: 10/28/22  Last dose of GLP1 agonist- Trulicity.  GLP1 instructions: To hold it after 12/06/22 dose.  Last dose of SGLT-2 inhibitors- N/A SGLT-2 instructions:   Blood Thinner Instructions: Aspirin Instructions: Will be held after 12/07/22 Last Dose:  Activity level: Can go up a flight of stairs and activities of daily living without stopping and without chest pain and/or shortness of breath   Able to exercise without chest pain and/or shortness of breath  Anesthesia review: Hx: HTN,DIA,Abnormal EKG.  Patient denies shortness of breath, fever, cough and chest pain at PAT appointment   Patient verbalized understanding of instructions that were given to them at the PAT appointment. Patient was also instructed that they will need to review over the PAT instructions again at home before surgery.

## 2022-12-04 ENCOUNTER — Encounter (HOSPITAL_COMMUNITY): Payer: Self-pay | Admitting: Anesthesiology

## 2022-12-04 ENCOUNTER — Encounter (HOSPITAL_COMMUNITY): Payer: Self-pay | Admitting: Physician Assistant

## 2022-12-09 NOTE — Progress Notes (Signed)
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 never smoker with h/o HTN, DM II (A1C 6.4), failed right total knee prosthesis scheduled for above procedure 12/15/2022 with Dr. Weber Cooks.  Patient seen by cardiology 11/07/2022 for preoperative evaluation.  She was referred to cardiology by PCP due to multiple cardiovascular risk factors including diabetes, obesity, HTN.  Per cardiology notes 12/03/2022, "patient is to inform you that the above-mentioned patient is a low cardiovascular risk for her upcoming orthopedic surgery and should proceed with no additional cardiac testing.  She should continue all her current medications for CV risk factor management throughout."  Anticipate pt can proceed with planned procedure barring acute status change.   VS: BP 133/77 (BP Location: Right Arm)   Pulse 80   Temp (!) 36.4 C (Oral)   Resp 18   Ht 5\' 4"  (1.626 m)   Wt 100.7 kg   SpO2 99%   BMI 38.11 kg/m   PROVIDERS: Romeo Rabon, MD is PCP    LABS: Labs reviewed: Acceptable for surgery. (all labs ordered are listed, but only abnormal results are displayed)  Labs Reviewed  SURGICAL PCR SCREEN - Abnormal; Notable for the following components:      Result Value   Staphylococcus aureus POSITIVE (*)    All other components within normal limits  COMPREHENSIVE METABOLIC PANEL - Abnormal; Notable for the following components:   Glucose, Bld 121 (*)    BUN 25 (*)    Creatinine, Ser 1.02 (*)    Total Bilirubin 1.5 (*)    All other components within normal limits  GLUCOSE, CAPILLARY - Abnormal; Notable for the following components:   Glucose-Capillary 134 (*)    All other components within normal  limits  CBC WITH DIFFERENTIAL/PLATELET  TYPE AND SCREEN     IMAGES:   EKG:   CV:  Past Medical History:  Diagnosis Date   Arthritis    "all over"   Dental crowns present    Hypercholesterolemia    Hypertension    under control with med., has been on med. x 6-8 yr.   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    Past Surgical History:  Procedure Laterality Date   ABDOMINAL HYSTERECTOMY     partial   BIOPSY  03/31/2019   Procedure: BIOPSY;  Surgeon: Malissa Hippo, MD;  [facility];  Service: Endoscopy;;  duodenum gastric   CHOLECYSTECTOMY     COLONOSCOPY N/A 03/31/2019   Procedure: COLONOSCOPY;  Surgeon: Malissa Hippo, MD;  [facility];  Service: Endoscopy;  Laterality: N/A;   ESOPHAGOGASTRODUODENOSCOPY N/A 03/31/2019   Procedure: ESOPHAGOGASTRODUODENOSCOPY (EGD);  Surgeon: Malissa Hippo, MD;  [facility];  Service: Endoscopy;  Laterality: N/A;  200-office moved to 12:00pm   LUMBAR LAMINECTOMY     prior to 2013 surgery   LUMBAR LAMINECTOMY/DECOMPRESSION MICRODISCECTOMY  04/29/2012   Procedure: LUMBAR LAMINECTOMY/DECOMPRESSION MICRODISCECTOMY 1 LEVEL;  Surgeon: Maeola Harman, MD;  Location: [facility];  Service: Neurosurgery;  Laterality: Left;  Left Lumbar Four-Five Redo Microdiskectomy  POLYPECTOMY  03/31/2019   Procedure: POLYPECTOMY;  Surgeon: Malissa Hippo, MD;  [facility];  Service: Endoscopy;;   SHOULDER ARTHROSCOPY W/ ROTATOR CUFF REPAIR Left    TONSILLECTOMY     TOTAL KNEE ARTHROPLASTY Bilateral    ULNAR SHORTENING WITH BONE GRAFT Left 01/07/2018   Procedure: Repair ULNAR styloid non-union WITH BONE GRAFT;  Surgeon: Cindee Salt, MD;  Location: [facility];  Service: Orthopedics;  Laterality: Left;  axillary block    MEDICATIONS:  acetaminophen (TYLENOL) 650 MG CR tablet   ARTIFICIAL TEAR SOLUTION OP   aspirin  EC 81 MG tablet   atorvastatin (LIPITOR) 80 MG tablet   DULoxetine (CYMBALTA) 60 MG capsule   famotidine (PEPCID) 20 MG tablet   FIASP FLEXTOUCH 100 UNIT/ML FlexTouch Pen   Insulin Glargine, 2 Unit Dial, (TOUJEO MAX SOLOSTAR) 300 UNIT/ML SOPN   losartan (COZAAR) 50 MG tablet   meloxicam (MOBIC) 15 MG tablet   omeprazole (PRILOSEC) 40 MG capsule   oxymetazoline (AFRIN) 0.05 % nasal spray   OZEMPIC, 1 MG/DOSE, 4 MG/3ML SOPN   pioglitazone (ACTOS) 15 MG tablet   Vitamin D, Ergocalciferol, (DRISDOL) 1.25 MG (50000 UT) CAPS capsule   No current facility-administered medications for this encounter.     Jodell Cipro Ward, PA-C [facility] [phone]

## 2022-12-09 NOTE — Anesthesia Preprocedure Evaluation (Deleted)
Anesthesia Evaluation    Airway        Dental   Pulmonary           Cardiovascular hypertension,      Neuro/Psych    GI/Hepatic   Endo/Other  diabetes    Renal/GU      Musculoskeletal   Abdominal   Peds  Hematology   Anesthesia Other Findings   Reproductive/Obstetrics                             Anesthesia Physical Anesthesia Plan  ASA:   Anesthesia Plan:    Post-op Pain Management:    Induction:   PONV Risk Score and Plan:   Airway Management Planned:   Additional Equipment:   Intra-op Plan:   Post-operative Plan:   Informed Consent:   Plan Discussed with:   Anesthesia Plan Comments: (See PAT note 12/02/2022)       Anesthesia Quick Evaluation

## 2022-12-15 ENCOUNTER — Inpatient Hospital Stay (HOSPITAL_COMMUNITY): Admission: RE | Admit: 2022-12-15 | Payer: Medicare Other | Source: Ambulatory Visit | Admitting: Orthopedic Surgery

## 2022-12-15 ENCOUNTER — Encounter (HOSPITAL_COMMUNITY): Admission: RE | Payer: Self-pay | Source: Ambulatory Visit

## 2022-12-15 SURGERY — TOTAL KNEE REVISION
Anesthesia: Spinal | Site: Knee | Laterality: Right

## 2022-12-18 ENCOUNTER — Encounter (HOSPITAL_COMMUNITY): Payer: Self-pay | Admitting: Orthopedic Surgery

## 2022-12-18 ENCOUNTER — Other Ambulatory Visit: Payer: Self-pay

## 2022-12-18 NOTE — Anesthesia Preprocedure Evaluation (Addendum)
Anesthesia Evaluation  Patient identified by MRN, date of birth, ID band Patient awake    Reviewed: Allergy & Precautions, H&P , NPO status , Patient's Chart, lab work & pertinent test results  History of Anesthesia Complications (+) PONV and history of anesthetic complications  Airway Mallampati: III  TM Distance: >3 FB Neck ROM: Full    Dental no notable dental hx. (+) Teeth Intact, Dental Advisory Given   Pulmonary neg pulmonary ROS   Pulmonary exam normal breath sounds clear to auscultation       Cardiovascular Exercise Tolerance: Good hypertension, Pt. on medications  Rhythm:Regular Rate:Normal     Neuro/Psych negative neurological ROS  negative psych ROS   GI/Hepatic Neg liver ROS,GERD  Medicated,,  Endo/Other  diabetes, Type 2, Oral Hypoglycemic Agents  Morbid obesity  Renal/GU negative Renal ROS  negative genitourinary   Musculoskeletal  (+) Arthritis , Osteoarthritis,    Abdominal   Peds  Hematology negative hematology ROS (+)   Anesthesia Other Findings   Reproductive/Obstetrics negative OB ROS                             Anesthesia Physical Anesthesia Plan  ASA: 3  Anesthesia Plan: Spinal   Post-op Pain Management: Regional block* and Tylenol PO (pre-op)*   Induction: Intravenous  PONV Risk Score and Plan: 4 or greater and Ondansetron, Dexamethasone, Propofol infusion and Midazolam  Airway Management Planned: Natural Airway and Simple Face Mask  Additional Equipment:   Intra-op Plan:   Post-operative Plan:   Informed Consent: I have reviewed the patients History and Physical, chart, labs and discussed the procedure including the risks, benefits and alternatives for the proposed anesthesia with the patient or authorized representative who has indicated his/her understanding and acceptance.     Dental advisory given  Plan Discussed with: CRNA  Anesthesia  Plan Comments:        Anesthesia Quick Evaluation

## 2022-12-18 NOTE — Progress Notes (Signed)
Phyllis. Phyllis Leonard denies chest pain or shortness of breath.  Patient denies having any s/s of Covid in her household, also denies any known exposure to Covid. Phyllis Leonard denies  any s/s of upper or lower respiratory  infections in the past 8 weeks.   Phyllis Leonard was seen in [facility] 12/02/22, patient was scheduled for surgery on 12/15/22, patient had a kidney stone on 12/14/22, knee surgery was cancelled.  Phyllis Leonard has pre- op instructions, we reviewed the instructions, patient did not have any questions.

## 2022-12-19 ENCOUNTER — Encounter (HOSPITAL_COMMUNITY): Payer: Self-pay | Admitting: Orthopedic Surgery

## 2022-12-19 ENCOUNTER — Inpatient Hospital Stay (HOSPITAL_COMMUNITY): Payer: Medicare Other

## 2022-12-19 ENCOUNTER — Inpatient Hospital Stay (HOSPITAL_COMMUNITY)
Admission: RE | Admit: 2022-12-19 | Discharge: 2022-12-22 | DRG: 468 | Disposition: A | Discharge status: Home Health | Payer: Medicare Other | Attending: Orthopedic Surgery | Admitting: Orthopedic Surgery

## 2022-12-19 ENCOUNTER — Other Ambulatory Visit: Payer: Self-pay

## 2022-12-19 ENCOUNTER — Inpatient Hospital Stay (HOSPITAL_COMMUNITY): Payer: Medicare Other | Admitting: Anesthesiology

## 2022-12-19 ENCOUNTER — Encounter (HOSPITAL_COMMUNITY)
Admission: RE | Disposition: A | Discharge status: Home Health | Payer: Self-pay | Source: Home / Self Care | Attending: Orthopedic Surgery

## 2022-12-19 DIAGNOSIS — E119 Type 2 diabetes mellitus without complications: Secondary | ICD-10-CM | POA: Diagnosis present

## 2022-12-19 DIAGNOSIS — T84032A Mechanical loosening of internal right knee prosthetic joint, initial encounter: Secondary | ICD-10-CM

## 2022-12-19 DIAGNOSIS — T84018A Broken internal joint prosthesis, other site, initial encounter: Principal | ICD-10-CM

## 2022-12-19 DIAGNOSIS — Z90711 Acquired absence of uterus with remaining cervical stump: Secondary | ICD-10-CM

## 2022-12-19 DIAGNOSIS — Z7982 Long term (current) use of aspirin: Secondary | ICD-10-CM

## 2022-12-19 DIAGNOSIS — M21161 Varus deformity, not elsewhere classified, right knee: Secondary | ICD-10-CM | POA: Diagnosis present

## 2022-12-19 DIAGNOSIS — Z791 Long term (current) use of non-steroidal anti-inflammatories (NSAID): Secondary | ICD-10-CM

## 2022-12-19 DIAGNOSIS — M1711 Unilateral primary osteoarthritis, right knee: Secondary | ICD-10-CM | POA: Diagnosis present

## 2022-12-19 DIAGNOSIS — Z79899 Other long term (current) drug therapy: Secondary | ICD-10-CM

## 2022-12-19 DIAGNOSIS — Z96652 Presence of left artificial knee joint: Secondary | ICD-10-CM | POA: Diagnosis present

## 2022-12-19 DIAGNOSIS — Z9049 Acquired absence of other specified parts of digestive tract: Secondary | ICD-10-CM

## 2022-12-19 DIAGNOSIS — E7800 Pure hypercholesterolemia, unspecified: Secondary | ICD-10-CM | POA: Diagnosis present

## 2022-12-19 DIAGNOSIS — Z87442 Personal history of urinary calculi: Secondary | ICD-10-CM | POA: Diagnosis not present

## 2022-12-19 DIAGNOSIS — Z794 Long term (current) use of insulin: Secondary | ICD-10-CM

## 2022-12-19 DIAGNOSIS — I10 Essential (primary) hypertension: Secondary | ICD-10-CM

## 2022-12-19 DIAGNOSIS — Z7984 Long term (current) use of oral hypoglycemic drugs: Secondary | ICD-10-CM

## 2022-12-19 DIAGNOSIS — Z7985 Long-term (current) use of injectable non-insulin antidiabetic drugs: Secondary | ICD-10-CM | POA: Diagnosis not present

## 2022-12-19 DIAGNOSIS — M25561 Pain in right knee: Secondary | ICD-10-CM | POA: Diagnosis present

## 2022-12-19 DIAGNOSIS — Y792 Prosthetic and other implants, materials and accessory orthopedic devices associated with adverse incidents: Secondary | ICD-10-CM | POA: Diagnosis present

## 2022-12-19 DIAGNOSIS — Z86010 Personal history of colonic polyps: Secondary | ICD-10-CM | POA: Diagnosis not present

## 2022-12-19 DIAGNOSIS — K219 Gastro-esophageal reflux disease without esophagitis: Secondary | ICD-10-CM | POA: Diagnosis present

## 2022-12-19 DIAGNOSIS — Z6837 Body mass index (BMI) 37.0-37.9, adult: Secondary | ICD-10-CM

## 2022-12-19 DIAGNOSIS — I2510 Atherosclerotic heart disease of native coronary artery without angina pectoris: Secondary | ICD-10-CM

## 2022-12-19 HISTORY — DX: Other specified postprocedural states: R11.2

## 2022-12-19 HISTORY — PX: TOTAL KNEE REVISION: SHX996

## 2022-12-19 HISTORY — DX: Personal history of urinary calculi: Z87.442

## 2022-12-19 HISTORY — DX: Other specified postprocedural states: Z98.890

## 2022-12-19 LAB — GLUCOSE, CAPILLARY
Glucose-Capillary: 148 mg/dL — ABNORMAL HIGH (ref 70–99)
Glucose-Capillary: 128 mg/dL — ABNORMAL HIGH (ref 70–99)
Glucose-Capillary: 197 mg/dL — ABNORMAL HIGH (ref 70–99)
Glucose-Capillary: 219 mg/dL — ABNORMAL HIGH (ref 70–99)
Glucose-Capillary: 173 mg/dL — ABNORMAL HIGH (ref 70–99)

## 2022-12-19 LAB — AEROBIC/ANAEROBIC CULTURE W GRAM STAIN (SURGICAL/DEEP WOUND)

## 2022-12-19 LAB — HEMOGLOBIN A1C
Hgb A1c MFr Bld: 5.9 % — ABNORMAL HIGH (ref 4.8–5.6)
Mean Plasma Glucose: 122.63 mg/dL

## 2022-12-19 LAB — TYPE AND SCREEN
ABO/RH(D): O POS
Antibody Screen: NEGATIVE

## 2022-12-19 SURGERY — TOTAL KNEE REVISION
Anesthesia: Spinal | Site: Knee | Laterality: Right

## 2022-12-19 MED ORDER — FAMOTIDINE 20 MG PO TABS: 20.0000 mg | ORAL_TABLET | Freq: Every evening | ORAL | Status: DC | PRN

## 2022-12-19 MED ORDER — ACETAMINOPHEN 500 MG PO TABS: 1000.0000 mg | ORAL_TABLET | Freq: Three times a day (TID) | ORAL | 0 refills | Status: AC | PRN

## 2022-12-19 MED ORDER — CEFAZOLIN SODIUM-DEXTROSE 2-4 GM/100ML-% IV SOLN
2.0000 g | Freq: Three times a day (TID) | INTRAVENOUS | Status: DC
  Administered 2022-12-19 – 2022-12-22 (×8): 2 g via INTRAVENOUS
  Filled 2022-12-19 (×8): qty 100

## 2022-12-19 MED ORDER — DEXAMETHASONE SODIUM PHOSPHATE 10 MG/ML IJ SOLN
8.0000 mg | Freq: Once | INTRAMUSCULAR | Status: AC
  Administered 2022-12-19: 8 mg via INTRAVENOUS

## 2022-12-19 MED ORDER — SODIUM CHLORIDE 0.9 % IV SOLN: INTRAVENOUS | Status: DC

## 2022-12-19 MED ORDER — LOSARTAN POTASSIUM 50 MG PO TABS
50.0000 mg | ORAL_TABLET | Freq: Every day | ORAL | Status: DC
  Administered 2022-12-20 – 2022-12-22 (×3): 50 mg via ORAL
  Filled 2022-12-19 (×3): qty 1

## 2022-12-19 MED ORDER — BUPIVACAINE-EPINEPHRINE (PF) 0.25% -1:200000 IJ SOLN
INTRAMUSCULAR | Status: AC
  Filled 2022-12-19: qty 30

## 2022-12-19 MED ORDER — LIDOCAINE 2% (20 MG/ML) 5 ML SYRINGE
INTRAMUSCULAR | Status: AC
  Filled 2022-12-19: qty 5

## 2022-12-19 MED ORDER — ATORVASTATIN CALCIUM 80 MG PO TABS
80.0000 mg | ORAL_TABLET | Freq: Every evening | ORAL | Status: DC
  Administered 2022-12-19 – 2022-12-21 (×3): 80 mg via ORAL
  Filled 2022-12-19 (×3): qty 1

## 2022-12-19 MED ORDER — SODIUM CHLORIDE 0.9 % IR SOLN
Status: DC | PRN
  Administered 2022-12-19: 3000 mL

## 2022-12-19 MED ORDER — FENTANYL CITRATE (PF) 100 MCG/2ML IJ SOLN
100.0000 ug | Freq: Once | INTRAMUSCULAR | Status: AC
  Administered 2022-12-19: 100 ug via INTRAVENOUS

## 2022-12-19 MED ORDER — DULOXETINE HCL 60 MG PO CPEP
60.0000 mg | ORAL_CAPSULE | Freq: Every evening | ORAL | Status: DC
  Administered 2022-12-19 – 2022-12-21 (×3): 60 mg via ORAL
  Filled 2022-12-19 (×3): qty 1

## 2022-12-19 MED ORDER — PROPOFOL 10 MG/ML IV BOLUS
INTRAVENOUS | Status: DC | PRN
  Administered 2022-12-19: 10 mg via INTRAVENOUS
  Administered 2022-12-19: 20 mg via INTRAVENOUS
  Administered 2022-12-19 (×4): 10 mg via INTRAVENOUS

## 2022-12-19 MED ORDER — BUPIVACAINE-EPINEPHRINE 0.25% -1:200000 IJ SOLN
INTRAMUSCULAR | Status: DC | PRN
  Administered 2022-12-19: 15 mL

## 2022-12-19 MED ORDER — BUPIVACAINE LIPOSOME 1.3 % IJ SUSP
INTRAMUSCULAR | Status: AC
  Filled 2022-12-19: qty 20

## 2022-12-19 MED ORDER — ACETAMINOPHEN 325 MG PO TABS
325.0000 mg | ORAL_TABLET | Freq: Four times a day (QID) | ORAL | Status: DC | PRN
  Administered 2022-12-21: 650 mg via ORAL
  Filled 2022-12-19: qty 2

## 2022-12-19 MED ORDER — SURGIRINSE WOUND IRRIGATION SYSTEM - OPTIME: TOPICAL | Status: DC | PRN

## 2022-12-19 MED ORDER — PANTOPRAZOLE SODIUM 40 MG PO TBEC
40.0000 mg | DELAYED_RELEASE_TABLET | Freq: Every day | ORAL | Status: DC
  Administered 2022-12-19 – 2022-12-22 (×4): 40 mg via ORAL
  Filled 2022-12-19 (×4): qty 1

## 2022-12-19 MED ORDER — MENTHOL 3 MG MT LOZG: 1.0000 | LOZENGE | OROMUCOSAL | Status: DC | PRN

## 2022-12-19 MED ORDER — INSULIN ASPART 100 UNIT/ML IJ SOLN
0.0000 [IU] | Freq: Three times a day (TID) | INTRAMUSCULAR | Status: DC
  Administered 2022-12-19: 5 [IU] via SUBCUTANEOUS
  Administered 2022-12-20 (×2): 3 [IU] via SUBCUTANEOUS
  Administered 2022-12-20 – 2022-12-22 (×4): 2 [IU] via SUBCUTANEOUS

## 2022-12-19 MED ORDER — CEFAZOLIN SODIUM-DEXTROSE 2-4 GM/100ML-% IV SOLN
2.0000 g | INTRAVENOUS | Status: AC
  Administered 2022-12-19 (×2): 2 g via INTRAVENOUS

## 2022-12-19 MED ORDER — INSULIN ASPART 100 UNIT/ML IJ SOLN: 0.0000 [IU] | INTRAMUSCULAR | Status: DC | PRN

## 2022-12-19 MED ORDER — HYDROMORPHONE HCL 1 MG/ML IJ SOLN
INTRAMUSCULAR | Status: AC
  Filled 2022-12-19: qty 1

## 2022-12-19 MED ORDER — ONDANSETRON HCL 4 MG PO TABS: 4.0000 mg | ORAL_TABLET | Freq: Four times a day (QID) | ORAL | Status: DC | PRN

## 2022-12-19 MED ORDER — ASPIRIN 81 MG PO CHEW
81.0000 mg | CHEWABLE_TABLET | Freq: Two times a day (BID) | ORAL | Status: DC
  Administered 2022-12-19 – 2022-12-22 (×6): 81 mg via ORAL
  Filled 2022-12-19 (×6): qty 1

## 2022-12-19 MED ORDER — DEXMEDETOMIDINE HCL IN NACL 80 MCG/20ML IV SOLN
INTRAVENOUS | Status: DC | PRN
  Administered 2022-12-19: 4 ug via INTRAVENOUS
  Administered 2022-12-19 (×4): 8 ug via INTRAVENOUS
  Administered 2022-12-19: 4 ug via INTRAVENOUS

## 2022-12-19 MED ORDER — TRANEXAMIC ACID-NACL 1000-0.7 MG/100ML-% IV SOLN
1000.0000 mg | INTRAVENOUS | Status: AC
  Administered 2022-12-19: 1000 mg via INTRAVENOUS

## 2022-12-19 MED ORDER — HYDROMORPHONE HCL 1 MG/ML IJ SOLN
0.5000 mg | INTRAMUSCULAR | Status: DC | PRN
  Administered 2022-12-20 – 2022-12-21 (×2): 1 mg via INTRAVENOUS
  Filled 2022-12-19 (×2): qty 1

## 2022-12-19 MED ORDER — ACETAMINOPHEN 500 MG PO TABS
1000.0000 mg | ORAL_TABLET | Freq: Once | ORAL | Status: DC
Start: 2022-12-19 — End: 2022-12-19

## 2022-12-19 MED ORDER — FENTANYL CITRATE (PF) 250 MCG/5ML IJ SOLN
INTRAMUSCULAR | Status: AC
  Filled 2022-12-19: qty 5

## 2022-12-19 MED ORDER — LIDOCAINE 2% (20 MG/ML) 5 ML SYRINGE
INTRAMUSCULAR | Status: DC | PRN
  Administered 2022-12-19: 20 mg via INTRAVENOUS

## 2022-12-19 MED ORDER — OXYMETAZOLINE HCL 0.05 % NA SOLN: 1.0000 | Freq: Two times a day (BID) | NASAL | Status: DC | PRN

## 2022-12-19 MED ORDER — INSULIN GLARGINE (2 UNIT DIAL) 300 UNIT/ML ~~LOC~~ SOPN: 56.0000 [IU] | PEN_INJECTOR | Freq: Two times a day (BID) | SUBCUTANEOUS | Status: DC

## 2022-12-19 MED ORDER — OXYCODONE HCL 5 MG PO TABS
5.0000 mg | ORAL_TABLET | ORAL | Status: DC | PRN
  Administered 2022-12-19 – 2022-12-22 (×8): 10 mg via ORAL
  Filled 2022-12-19 (×8): qty 2

## 2022-12-19 MED ORDER — POLYETHYLENE GLYCOL 3350 17 G PO PACK: 17.0000 g | PACK | Freq: Every day | ORAL | Status: DC | PRN

## 2022-12-19 MED ORDER — VANCOMYCIN HCL 1000 MG IV SOLR
INTRAVENOUS | Status: AC
  Filled 2022-12-19: qty 20

## 2022-12-19 MED ORDER — CEFAZOLIN SODIUM-DEXTROSE 2-4 GM/100ML-% IV SOLN
INTRAVENOUS | Status: AC
  Filled 2022-12-19: qty 100

## 2022-12-19 MED ORDER — ACETAMINOPHEN 500 MG PO TABS
1000.0000 mg | ORAL_TABLET | Freq: Four times a day (QID) | ORAL | Status: AC
  Administered 2022-12-19 – 2022-12-20 (×4): 1000 mg via ORAL
  Filled 2022-12-19 (×4): qty 2

## 2022-12-19 MED ORDER — FENTANYL CITRATE (PF) 100 MCG/2ML IJ SOLN
100.0000 ug | INTRAMUSCULAR | Status: DC
  Filled 2022-12-19: qty 2

## 2022-12-19 MED ORDER — ZOLPIDEM TARTRATE 5 MG PO TABS: 5.0000 mg | ORAL_TABLET | Freq: Every evening | ORAL | Status: DC | PRN

## 2022-12-19 MED ORDER — ONDANSETRON HCL 4 MG/2ML IJ SOLN
4.0000 mg | Freq: Four times a day (QID) | INTRAMUSCULAR | Status: DC | PRN
  Administered 2022-12-19: 4 mg via INTRAVENOUS
  Filled 2022-12-19: qty 2

## 2022-12-19 MED ORDER — PHENYLEPHRINE 80 MCG/ML (10ML) SYRINGE FOR IV PUSH (FOR BLOOD PRESSURE SUPPORT)
PREFILLED_SYRINGE | INTRAVENOUS | Status: DC | PRN
  Administered 2022-12-19: 80 ug via INTRAVENOUS

## 2022-12-19 MED ORDER — PIOGLITAZONE HCL 15 MG PO TABS
15.0000 mg | ORAL_TABLET | Freq: Every day | ORAL | Status: DC
  Administered 2022-12-21 – 2022-12-22 (×2): 15 mg via ORAL
  Filled 2022-12-19 (×3): qty 1

## 2022-12-19 MED ORDER — ASPIRIN 81 MG PO TBEC: 81.0000 mg | DELAYED_RELEASE_TABLET | Freq: Two times a day (BID) | ORAL | 0 refills | Status: AC

## 2022-12-19 MED ORDER — SODIUM CHLORIDE (PF) 0.9 % IJ SOLN
INTRAMUSCULAR | Status: AC
  Filled 2022-12-19: qty 50

## 2022-12-19 MED ORDER — OXYCODONE HCL 5 MG PO TABS: 5.0000 mg | ORAL_TABLET | ORAL | 0 refills | Status: AC | PRN

## 2022-12-19 MED ORDER — DEXAMETHASONE SODIUM PHOSPHATE 10 MG/ML IJ SOLN
INTRAMUSCULAR | Status: AC
  Filled 2022-12-19: qty 1

## 2022-12-19 MED ORDER — 0.9 % SODIUM CHLORIDE (POUR BTL) OPTIME
TOPICAL | Status: DC | PRN
  Administered 2022-12-19: 1000 mL

## 2022-12-19 MED ORDER — BUPIVACAINE LIPOSOME 1.3 % IJ SUSP: 20.0000 mL | Freq: Once | INTRAMUSCULAR | Status: DC

## 2022-12-19 MED ORDER — FENTANYL CITRATE (PF) 250 MCG/5ML IJ SOLN
INTRAMUSCULAR | Status: DC | PRN
  Administered 2022-12-19 (×2): 50 ug via INTRAVENOUS

## 2022-12-19 MED ORDER — BUPIVACAINE IN DEXTROSE 0.75-8.25 % IT SOLN
INTRATHECAL | Status: DC | PRN
  Administered 2022-12-19: 2 mL via INTRATHECAL

## 2022-12-19 MED ORDER — PROPOFOL 10 MG/ML IV BOLUS
INTRAVENOUS | Status: AC
  Filled 2022-12-19: qty 20

## 2022-12-19 MED ORDER — CEFADROXIL 500 MG PO CAPS: 500.0000 mg | ORAL_CAPSULE | Freq: Two times a day (BID) | ORAL | 0 refills | Status: AC

## 2022-12-19 MED ORDER — METHOCARBAMOL 500 MG PO TABS
500.0000 mg | ORAL_TABLET | Freq: Four times a day (QID) | ORAL | Status: DC | PRN
  Administered 2022-12-19 – 2022-12-21 (×3): 500 mg via ORAL
  Filled 2022-12-19 (×4): qty 1

## 2022-12-19 MED ORDER — INSULIN ASPART 100 UNIT/ML IJ SOLN: 0.0000 [IU] | Freq: Three times a day (TID) | INTRAMUSCULAR | Status: DC

## 2022-12-19 MED ORDER — POVIDONE-IODINE 10 % EX SWAB
2.0000 | Freq: Once | CUTANEOUS | Status: AC
  Administered 2022-12-19: 2 via TOPICAL

## 2022-12-19 MED ORDER — BUPIVACAINE LIPOSOME 1.3 % IJ SUSP
INTRAMUSCULAR | Status: DC | PRN
  Administered 2022-12-19: 15 mL

## 2022-12-19 MED ORDER — ONDANSETRON HCL 4 MG PO TABS: 4.0000 mg | ORAL_TABLET | Freq: Three times a day (TID) | ORAL | 0 refills | Status: AC | PRN

## 2022-12-19 MED ORDER — ACETAMINOPHEN 500 MG PO TABS: 1000.0000 mg | ORAL_TABLET | Freq: Once | ORAL | Status: AC

## 2022-12-19 MED ORDER — ONDANSETRON HCL 4 MG/2ML IJ SOLN
INTRAMUSCULAR | Status: DC | PRN
  Administered 2022-12-19 (×2): 4 mg via INTRAVENOUS

## 2022-12-19 MED ORDER — ACETAMINOPHEN 500 MG PO TABS
ORAL_TABLET | ORAL | Status: AC
  Administered 2022-12-19: 1000 mg via ORAL
  Filled 2022-12-19: qty 2

## 2022-12-19 MED ORDER — KETOROLAC TROMETHAMINE 30 MG/ML IJ SOLN
INTRAMUSCULAR | Status: AC
  Filled 2022-12-19: qty 1

## 2022-12-19 MED ORDER — PHENOL 1.4 % MT LIQD: 1.0000 | OROMUCOSAL | Status: DC | PRN

## 2022-12-19 MED ORDER — LACTATED RINGERS IV SOLN: INTRAVENOUS | Status: DC

## 2022-12-19 MED ORDER — CEFAZOLIN SODIUM-DEXTROSE 2-4 GM/100ML-% IV SOLN: 2.0000 g | Freq: Four times a day (QID) | INTRAVENOUS | Status: DC

## 2022-12-19 MED ORDER — KETOROLAC TROMETHAMINE 30 MG/ML IJ SOLN
INTRAMUSCULAR | Status: DC | PRN
  Administered 2022-12-19: 15 mg via INTRAVENOUS

## 2022-12-19 MED ORDER — MELOXICAM 7.5 MG PO TABS
15.0000 mg | ORAL_TABLET | Freq: Every day | ORAL | Status: DC
  Administered 2022-12-20 – 2022-12-22 (×3): 15 mg via ORAL
  Filled 2022-12-19 (×3): qty 2

## 2022-12-19 MED ORDER — METHOCARBAMOL 500 MG PO TABS: 500.0000 mg | ORAL_TABLET | Freq: Three times a day (TID) | ORAL | 0 refills | Status: AC | PRN

## 2022-12-19 MED ORDER — PHENYLEPHRINE HCL-NACL 20-0.9 MG/250ML-% IV SOLN
INTRAVENOUS | Status: DC | PRN
  Administered 2022-12-19: 20 ug/min via INTRAVENOUS

## 2022-12-19 MED ORDER — TRANEXAMIC ACID-NACL 1000-0.7 MG/100ML-% IV SOLN
INTRAVENOUS | Status: AC
  Filled 2022-12-19: qty 100

## 2022-12-19 MED ORDER — MIDAZOLAM HCL 2 MG/2ML IJ SOLN: 2.0000 mg | INTRAMUSCULAR | Status: DC

## 2022-12-19 MED ORDER — ARTIFICIAL TEARS OPHTHALMIC OINT
TOPICAL_OINTMENT | OPHTHALMIC | Status: AC
  Filled 2022-12-19: qty 3.5

## 2022-12-19 MED ORDER — CHLORHEXIDINE GLUCONATE 0.12 % MT SOLN
15.0000 mL | Freq: Once | OROMUCOSAL | Status: AC
  Administered 2022-12-19: 15 mL via OROMUCOSAL

## 2022-12-19 MED ORDER — BUPIVACAINE-EPINEPHRINE (PF) 0.5% -1:200000 IJ SOLN
INTRAMUSCULAR | Status: DC | PRN
  Administered 2022-12-19: 20 mL via PERINEURAL

## 2022-12-19 MED ORDER — CHLORHEXIDINE GLUCONATE 0.12 % MT SOLN
OROMUCOSAL | Status: AC
  Filled 2022-12-19: qty 15

## 2022-12-19 MED ORDER — MIDAZOLAM HCL 2 MG/2ML IJ SOLN
INTRAMUSCULAR | Status: AC
  Filled 2022-12-19: qty 2

## 2022-12-19 MED ORDER — ORAL CARE MOUTH RINSE: 15.0000 mL | Freq: Once | OROMUCOSAL | Status: AC

## 2022-12-19 MED ORDER — MIDAZOLAM HCL 5 MG/5ML IJ SOLN
INTRAMUSCULAR | Status: DC | PRN
  Administered 2022-12-19: 2 mg via INTRAVENOUS

## 2022-12-19 MED ORDER — SODIUM CHLORIDE 0.9% FLUSH
INTRAVENOUS | Status: DC | PRN
  Administered 2022-12-19: 10 mL
  Administered 2022-12-19: 50 mL

## 2022-12-19 MED ORDER — PROPOFOL 500 MG/50ML IV EMUL
INTRAVENOUS | Status: DC | PRN
  Administered 2022-12-19: 25 ug/kg/min via INTRAVENOUS
  Administered 2022-12-19: 100 ug/kg/min via INTRAVENOUS

## 2022-12-19 MED ORDER — INSULIN GLARGINE-YFGN 100 UNIT/ML ~~LOC~~ SOLN
56.0000 [IU] | Freq: Two times a day (BID) | SUBCUTANEOUS | Status: DC
  Administered 2022-12-19 – 2022-12-22 (×6): 56 [IU] via SUBCUTANEOUS
  Filled 2022-12-19 (×7): qty 0.56

## 2022-12-19 MED ORDER — DOCUSATE SODIUM 100 MG PO CAPS
100.0000 mg | ORAL_CAPSULE | Freq: Two times a day (BID) | ORAL | Status: DC
  Administered 2022-12-19 – 2022-12-22 (×6): 100 mg via ORAL
  Filled 2022-12-19 (×6): qty 1

## 2022-12-19 MED ORDER — HYDROMORPHONE HCL 1 MG/ML IJ SOLN
0.2500 mg | INTRAMUSCULAR | Status: DC | PRN
  Administered 2022-12-19 (×2): 0.5 mg via INTRAVENOUS
  Administered 2022-12-19 (×2): 0.25 mg via INTRAVENOUS

## 2022-12-19 MED ORDER — DIPHENHYDRAMINE HCL 12.5 MG/5ML PO ELIX: 12.5000 mg | ORAL_SOLUTION | ORAL | Status: DC | PRN

## 2022-12-19 MED ORDER — METHOCARBAMOL 1000 MG/10ML IJ SOLN: 500.0000 mg | Freq: Four times a day (QID) | INTRAVENOUS | Status: DC | PRN

## 2022-12-19 SURGICAL SUPPLY — 86 items
ADH SKN CLS APL DERMABOND .7 (GAUZE/BANDAGES/DRESSINGS)
APL PRP STRL LF DISP 70% ISPRP (MISCELLANEOUS) ×1
AUG FEM 5 5+ 10 STRL KN POST (Post) ×1 IMPLANT
AUG FEM 5 5+ 5 STRL KN POST (Post) ×1 IMPLANT
AUG FEM 5 5+ 5 STRL LF KN DIST (Joint) ×3 IMPLANT
AUG TIB AB CONE CNTR STRL LF (Insert) ×1 IMPLANT
AUG TIB CD 5 HLF BLCK STRL RL (Joint) ×1 IMPLANT
AUG TIB CD 5 HLF BLCK STRL RM (Joint) ×1 IMPLANT
BAG COUNTER SPONGE SURGICOUNT (BAG) IMPLANT
BAG SPNG CNTER NS LX DISP (BAG)
BLADE HEX COATED 2.75 (ELECTRODE) ×1 IMPLANT
BLADE SAG 18X100X1.27 (BLADE) ×1 IMPLANT
BLADE SAW SAG 35X64 .89 (BLADE) ×1 IMPLANT
BLADE SAW SGTL 13X75X1.27 (BLADE) IMPLANT
BLOCK HALF TIB REV PS CD RL 5 (Joint) IMPLANT
BLOCK HALF TIB REV PS CD RM 5 (Joint) IMPLANT
BNDG CMPR MED 10X6 ELC LF (GAUZE/BANDAGES/DRESSINGS) ×1
BNDG ELASTIC 6X10 VLCR STRL LF (GAUZE/BANDAGES/DRESSINGS) ×1 IMPLANT
BOWL SMART MIX CTS (DISPOSABLE) ×1 IMPLANT
CEMENT BONE R 1X40 (Cement) ×2 IMPLANT
CEMENT BONE REFOBACIN R1X40 US (Cement) IMPLANT
CEMENT RESTRICTOR DEPUY SZ 3 (Cement) IMPLANT
CHLORAPREP W/TINT 26 (MISCELLANEOUS) ×2 IMPLANT
COMP FEM STD SZ5 (Joint) ×1 IMPLANT
COMPONENT FEM STD SZ5 (Joint) IMPLANT
COVER SURGICAL LIGHT HANDLE (MISCELLANEOUS) ×1 IMPLANT
CUFF TOURN SGL QUICK 34 (TOURNIQUET CUFF) ×1
CUFF TRNQT CYL 34X4.125X (TOURNIQUET CUFF) ×1 IMPLANT
DERMABOND ADVANCED .7 DNX12 (GAUZE/BANDAGES/DRESSINGS) ×1 IMPLANT
DRAPE INCISE IOBAN 66X45 STRL (DRAPES) IMPLANT
DRAPE INCISE IOBAN 85X60 (DRAPES) ×1 IMPLANT
DRAPE SHEET LG 3/4 BI-LAMINATE (DRAPES) ×1 IMPLANT
DRAPE U-SHAPE 47X51 STRL (DRAPES) ×1 IMPLANT
DRESSING AQUACEL AG SP 3.5X10 (GAUZE/BANDAGES/DRESSINGS) ×1 IMPLANT
DRESSING PEEL AND PLAC PRVNA20 (GAUZE/BANDAGES/DRESSINGS) IMPLANT
DRSG AQUACEL AG ADV 3.5X10 (GAUZE/BANDAGES/DRESSINGS) IMPLANT
DRSG AQUACEL AG SP 3.5X10 (GAUZE/BANDAGES/DRESSINGS)
DRSG PEEL AND PLACE PREVENA 20 (GAUZE/BANDAGES/DRESSINGS) ×1
GLOVE BIO SURGEON STRL SZ7.5 (GLOVE) ×2 IMPLANT
GLOVE BIOGEL PI IND STRL 7.5 (GLOVE) ×1 IMPLANT
GLOVE BIOGEL PI IND STRL 8 (GLOVE) ×1 IMPLANT
GLOVE SURG ENC MOIS LTX SZ8 (GLOVE) ×2 IMPLANT
GOWN STRL REUS W/TWL XL LVL3 (GOWN DISPOSABLE) ×1 IMPLANT
HANDPIECE INTERPULSE COAX TIP (DISPOSABLE) ×1
HOLDER FOLEY CATH W/STRAP (MISCELLANEOUS) IMPLANT
HOOD PEEL AWAY T7 (MISCELLANEOUS) ×3 IMPLANT
INSERT TIB FIX REV CMT SZC RT (Insert) IMPLANT
INSERT TIB PS CMTLS CC FX (Insert) IMPLANT
INSTR SCRW HEX REV FIX 3.5X48 (ORTHOPEDIC DISPOSABLE SUPPLIES) ×1
INSTRUMENT SCRW HEX REV 3.5X48 (ORTHOPEDIC DISPOSABLE SUPPLIES) IMPLANT
KIT DRSG PREVENA PLUS 7DAY 125 (MISCELLANEOUS) IMPLANT
LINER TIB FXD PS CD/3-5 12 RT (Liner) IMPLANT
MANIFOLD NEPTUNE II (INSTRUMENTS) ×1 IMPLANT
MARKER SKIN DUAL TIP RULER LAB (MISCELLANEOUS) ×2 IMPLANT
NS IRRIG 1000ML POUR BTL (IV SOLUTION) ×1 IMPLANT
PACK ICE MAXI GEL EZY WRAP (MISCELLANEOUS) IMPLANT
PACK TOTAL KNEE CUSTOM (KITS) ×1 IMPLANT
PIN DRILL HDLS TROCAR 75 4PK (PIN) IMPLANT
POST KNEE AUG PERS 5X10 (Post) IMPLANT
POST KNEE AUG PERS 5X5+ 5 (Post) IMPLANT
PROTECTOR NERVE ULNAR (MISCELLANEOUS) ×1 IMPLANT
SCREW HEX HEADED 3.5X27 DISP (ORTHOPEDIC DISPOSABLE SUPPLIES) IMPLANT
SET HNDPC FAN SPRY TIP SCT (DISPOSABLE) ×1 IMPLANT
SOLUTION IRRIG SURGIPHOR (IV SOLUTION) ×1 IMPLANT
SPIKE FLUID TRANSFER (MISCELLANEOUS) ×1 IMPLANT
SPONGE T-LAP 18X18 ~~LOC~~+RFID (SPONGE) ×3 IMPLANT
STEM FEM EXT PERS 75X14 (Stem) IMPLANT
STEM FEM KNEE OFFSET 135X12X3 (Stem) IMPLANT
SUT ETHILON 3 0 PS 1 (SUTURE) ×4 IMPLANT
SUT MNCRL AB 3-0 PS2 18 (SUTURE) ×1 IMPLANT
SUT STRATAFIX 0 PDS 27 VIOLET (SUTURE) ×1
SUT STRATAFIX 1PDS 45CM VIOLET (SUTURE) ×1 IMPLANT
SUT STRATAFIX PDO 1 14 VIOLET (SUTURE) ×1
SUT STRATFX PDO 1 14 VIOLET (SUTURE) ×1
SUT VIC AB 1 CT1 18XCR BRD 8 (SUTURE) IMPLANT
SUT VIC AB 1 CT1 8-18 (SUTURE) ×1
SUT VIC AB 2-0 CT1 (SUTURE) IMPLANT
SUT VIC AB 2-0 CT2 27 (SUTURE) ×2 IMPLANT
SUTURE STRATFX 0 PDS 27 VIOLET (SUTURE) ×1 IMPLANT
SUTURE STRATFX PDO 1 14 VIOLET (SUTURE) ×1 IMPLANT
SYR 50ML LL SCALE MARK (SYRINGE) ×1 IMPLANT
TOWER CARTRIDGE SMART MIX (DISPOSABLE) IMPLANT
TRAY FOLEY MTR SLVR 14FR STAT (SET/KITS/TRAYS/PACK) IMPLANT
TRAY FOLEY MTR SLVR 16FR STAT (SET/KITS/TRAYS/PACK) IMPLANT
TUBE SUCTION HIGH CAP CLEAR NV (SUCTIONS) ×1 IMPLANT
WEDGE FEM DIST PERS KNEE 5.5 (Joint) IMPLANT

## 2022-12-19 NOTE — Anesthesia Procedure Notes (Signed)
Spinal  Patient location during procedure: OR Start time: 12/19/2022 9:44 AM End time: 12/19/2022 9:49 AM Reason for block: surgical anesthesia Staffing Performed: anesthesiologist  Anesthesiologist: Gaynelle Adu, MD Performed by: Gaynelle Adu, MD Authorized by: Gaynelle Adu, MD   Preanesthetic Checklist Completed: patient identified, IV checked, risks and benefits discussed, surgical consent, monitors and equipment checked, pre-op evaluation and timeout performed Spinal Block Patient position: sitting Prep: DuraPrep Patient monitoring: cardiac monitor, continuous pulse ox and blood pressure Approach: midline Location: L3-4 Injection technique: single-shot Needle Needle type: Pencan  Needle gauge: 24 G Needle length: 9 cm Assessment Sensory level: T8 Events: CSF return Additional Notes Functioning IV was confirmed and monitors were applied. Sterile prep and drape, including hand hygiene and sterile gloves were used. The patient was positioned and the spine was prepped. The skin was anesthetized with lidocaine.  Free flow of clear CSF was obtained prior to injecting local anesthetic into the CSF.  The spinal needle aspirated freely following injection.  The needle was carefully withdrawn.  The patient tolerated the procedure well.

## 2022-12-19 NOTE — Transfer of Care (Signed)
Immediate Anesthesia Transfer of Care Note  Patient: Phyllis Leonard  Procedure(s) Performed: TOTAL KNEE REVISION (Right: Knee)  Patient Location: PACU  Anesthesia Type:Spinal and MAC combined with regional for post-op pain  Level of Consciousness: awake and alert   Airway & Oxygen Therapy: Patient Spontanous Breathing and Patient connected to face mask oxygen  Post-op Assessment: Report given to RN and Post -op Vital signs reviewed and stable  Post vital signs: Reviewed and stable  Last Vitals:  Vitals Value Taken Time  BP 131/81 12/19/22 1436  Temp    Pulse 71 12/19/22 1439  Resp 9 12/19/22 1439  SpO2 96 % 12/19/22 1439  Vitals shown include unvalidated device data.  Last Pain:  Vitals:   12/19/22 0805  TempSrc:   PainSc: 0-No pain         Complications: No notable events documented.

## 2022-12-19 NOTE — Anesthesia Postprocedure Evaluation (Signed)
Anesthesia Post Note  Patient: ZORIANNA TALIAFERRO  Procedure(s) Performed: TOTAL KNEE REVISION (Right: Knee)     Patient location during evaluation: PACU Anesthesia Type: Spinal and Regional Level of consciousness: oriented and awake and alert Pain management: pain level controlled Vital Signs Assessment: post-procedure vital signs reviewed and stable Respiratory status: spontaneous breathing, respiratory function stable and patient connected to nasal cannula oxygen Cardiovascular status: blood pressure returned to baseline and stable Postop Assessment: no headache, no backache, no apparent nausea or vomiting, spinal receding and patient able to bend at knees Anesthetic complications: no  No notable events documented.  Last Vitals:  Vitals:   12/19/22 1530 12/19/22 1545  BP: 137/80 (!) 151/79  Pulse: 82 72  Resp: 19 10  Temp:    SpO2: 100% 97%    Last Pain:  Vitals:   12/19/22 1545  TempSrc:   PainSc: 6                  Evalynne Locurto,W. EDMOND

## 2022-12-19 NOTE — Op Note (Signed)
DATE OF SURGERY:  12/19/2022 TIME: 1:25 PM  PATIENT NAME:  Phyllis Leonard   AGE: 67 y.o.    PRE-OPERATIVE DIAGNOSIS: Aseptic loosening right total knee arthroplasty  POST-OPERATIVE DIAGNOSIS:  Same  PROCEDURE: Revision right total knee arthroplasty  SURGEON:  Jermarion Poffenberger A Elka Satterfield, MD   ASSISTANT: Kathie Dike, PA-C, present and scrubbed throughout the case, critical for assistance with exposure, retraction, instrumentation, and closure.   OPERATIVE IMPLANTS:  Cemented Zimmer persona revision size 5 revision femur, 5 mm augments distal medial and lateral, 5 mm augment posterior medial, 10 mm augment posterior lateral, 135 x 12 mm press-fit revision femoral stem, size C tibial baseplate with 5 mm medial and lateral augments, 75 mm cemented stem extension, 12 mm CPS poly, fixed sized tibial central cone Implant Name Type Inv. Item Serial No. Manufacturer Lot No. LRB No. Used Action  CEMENT BONE REFOBACIN R1X40 [place] - [id number] Cement CEMENT BONE REFOBACIN R1X40 [place]  ZIMMER RECON(ORTH,TRAU,BIO,SG) [id number] Right 2 Implanted  CEMENT BONE REFOBACIN R1X40 [place] - [id number] Cement CEMENT BONE REFOBACIN R1X40 [place]  ZIMMER RECON(ORTH,TRAU,BIO,SG) [id number] Right 1 Implanted  BLOCK HALF TIB REV PS CD RM 5 - [id number] Joint BLOCK HALF TIB REV PS CD RM 5  ZIMMER RECON(ORTH,TRAU,BIO,SG) [id number] Right 1 Implanted  WEDGE FEM DIST PERS KNEE 5.5 - [id number] Joint WEDGE FEM DIST PERS KNEE 5.5  ZIMMER RECON(ORTH,TRAU,BIO,SG) [id number] Right 1 Implanted  COMP FEM STD SZ5 - [id number] Joint COMP FEM STD SZ5  ZIMMER RECON(ORTH,TRAU,BIO,SG) [id number] Right 1 Implanted  INSERT TIB PS CMTLS CC FX - [id number] Insert INSERT TIB PS CMTLS CC FX  ZIMMER RECON(ORTH,TRAU,BIO,SG) [id number] Right 1 Implanted  BLOCK HALF TIB REV PS CD RL 5 - [id number] Joint BLOCK HALF TIB REV PS CD RL 5  ZIMMER RECON(ORTH,TRAU,BIO,SG) [id number] Right 1 Implanted  INSERT TIB FIX REV CMT SZC RT - [id number] Insert INSERT TIB FIX REV CMT SZC RT   ZIMMER RECON(ORTH,TRAU,BIO,SG) [id number] Right 1 Implanted  STEM FEM EXT PERS 75X14 - [id number] Stem STEM FEM EXT PERS 75X14  ZIMMER RECON(ORTH,TRAU,BIO,SG) [id number] Right 1 Implanted  POST KNEE AUG PERS 5X10 - [id number] Post POST KNEE AUG PERS 5X10  ZIMMER RECON(ORTH,TRAU,BIO,SG) [id number] Right 1 Implanted  POST KNEE AUG PERS 5X5+ 5 - [id number] Post POST KNEE AUG PERS 5X5+ 5  ZIMMER RECON(ORTH,TRAU,BIO,SG) [id number] Right 1 Implanted  WEDGE FEM DIST PERS KNEE 5.5 - [id number] Joint WEDGE FEM DIST PERS KNEE 5.5  ZIMMER RECON(ORTH,TRAU,BIO,SG) [id number] Right 1 Implanted  WEDGE FEM DIST PERS KNEE 5.5 - [id number] Joint WEDGE FEM DIST PERS KNEE 5.5  ZIMMER RECON(ORTH,TRAU,BIO,SG) [id number] Right 1 Implanted  STEM FEM KNEE OFFSET 053Z76B3 - [id number] Stem STEM FEM KNEE OFFSET [id number]  ZIMMER RECON(ORTH,TRAU,BIO,SG) [id number] Right 1 Implanted  CEMENT RESTRICTOR DEPUY SZ 3 - [id number] Cement CEMENT RESTRICTOR DEPUY SZ 3  DEPUY ORTHOPAEDICS [id number] Right 1 Implanted  LINER TIB FXD PS CD/3-5 12 RT - [id number] Liner LINER TIB FXD PS CD/3-5 12 RT  ZIMMER RECON(ORTH,TRAU,BIO,SG) [id number] Right 1 Implanted      PREOPERATIVE INDICATIONS:  Phyllis Leonard is a 67 y.o. year old female with end stage bone on bone degenerative arthritis of the knee who failed conservative treatment, including injections, antiinflammatories, activity modification, and assistive devices, and had significant impairment of their activities of daily living, and elected for Total Knee Arthroplasty.   The risks, benefits, and alternatives were discussed at length including but not limited to the risks of infection, bleeding, nerve injury, stiffness, blood clots, the need for revision  surgery, cardiopulmonary complications, among others, and they were willing to proceed.  OPERATIVE FINDINGS AND UNIQUE ASPECTS OF THE CASE: Posterior medial oxidation and delamination of the rotating platform poly with significant wear leading to diffuse  osteolysis throughout the knee loosening of both the femoral and tibial component.  Notably significant medial subsidence of the tibial component.  ESTIMATED BLOOD LOSS: 200cc  OPERATIVE DESCRIPTION:   Once adequate anesthesia was induced, preoperative antibiotics, 2 gm of ancef,1 gm of Tranexamic Acid, and 8 mg of Decadron administered, the patient was positioned supine with a right thigh tourniquet placed.  The right lower extremity was prepped and draped in sterile fashion.  A time-  out was performed identifying the patient, planned procedure, and the appropriate extremity.     The leg was  exsanguinated, tourniquet elevated to 250 mmHg.  A midline incision was made through the patient's old scar however followed by median parapatellar arthrotomy. A medial release was performed, the infrapatellar fat pad was resected with care taken to protect the patellar tendon. The suprapatellar fat was removed to exposed the distal anterior femur.  Synovectomy was performed both medially and laterally, 2 deep tissue specimens were sent for aerobic anaerobic culture.  There was significant synovitis throughout the knee but no evidence of purulence or infection.  Following initial  exposure, the polyethylene liner was seen to be damaged with significant delamination oxidation posterior medial.  The rotating platform liner was able to be easily extracted from the knee.  Following this the edges of the femoral component were exposed.  Using a microsagittal ACL saw and osteotomes the femoral component was loosened.  Using the [facility] extractor it was able to be easily removed with minimal bone loss.  The tibial component was then assessed and found to be grossly loose.  It was able to be easily removed with no bone loss.  Using the [place] osteotomes to the cement plug on the femur and tibial side were able to be removed.  I then started reaming the canal space press-fit stems.  Notably on the tibial side I could not  get a 10 mm reamer seated which is the smallest possible tibial press-fit stem.  Elected to use a 75 mm cemented stem instead.  On the femur side I was able to ream up to a 12 mm reamer which had a good cortical fit.  At this point I turned my attention to finishing the tibia.  Using intramedullary guide to the tibia was recut at the level of the defect.  There was significant varus deformity and medial bone loss.  Following freshen up cut had good platform for the tibial component.  I sized the tibia to be a size C.  Pinned the tibia and marked my rotation and orientation.  Prepared the canal for the 75 mm stem, also prepared the proximal tibia for a cone.  Had good rotational stability with a fixed-bearing size cone.  A trial tibial component was placed with 5 mm augments.  We next turned our attention to preparation of the femur.  On the femoral side first recut the distal femur for 5 mm augments both medial and lateral roll.  Upon measuring which felt that would restore the joint line with 30 mm from the medial epicondyles medially and 25 mm to the joint line from the lateral epicondyle.  Compared to the patient's old component I felt that a size 5 was appropriate.  Upon sizing the femoral component felt she needed a little bit  of posterior offset given anterior bone loss.  Offset the tibial component 3 mm posterior.  I cut the posterior slot for a 5 mm augment medial and 10 mm lateral which had good bone purchase.  Trial femoral component was then assembled and put into place.  A 10 mm PS insert was placed.  This was upsized to the 12 mm insert which had good stability to varus valgus in extension and anterior posterior translation and flexion. The tourniquet was let down at this point.  The patella was tracking in the trochlear groove without any application of pressure.  The patella component was assessed and although there is some central wear it was well-fixed and elected to leave the old component  in place.  The trial components were then removed.  The real components were opened and assembled by myself on the back table including the stems and augments. The knee was thoroughly irrigated with pulse lavage normal saline irrigation. At this point 3 batches of antibiotic cement were opened and mixed.  A cement restrictor was placed down the canal of the tibial shaft.  First the tibial component was cemented into place.  Followed by the femoral component.  12 mm CPS trial insert was placed.  The knee was irrigated with sterile Betadine diluted in saline as well as pulse lavage normal saline. The synovial lining was  then injected a dilute Exparel mixed with 0.25% Marcaine with epinephrine.      Once the cement had fully cured, excess cement was removed throughout the knee.  I confirmed that I was satisfied with the range of motion and stability, and the final 12 mm CPS poly insert was chosen.  It was placed into the knee.         The tourniquet had been let down.  No significant hemostasis was required.  The medial parapatellar arthrotomy was then reapproximated using #1 vicryl, #1 Stratafix sutures with the knee  in flexion.  The remaining wound was closed with 0 stratafix, 2-0 Vicryl, and running 3-0 Monocryl. The knee was cleaned, dried, dressed sterilely using Dermabond and   Aquacel dressing.  The patient was then brought to recovery room in stable condition, tolerating the procedure  well. There were no complications.   Post op recs: WB: WBAT Abx: ancef continue in-house, follow-up IntraOp cultures, discharged with cefadroxil 500 twice daily x 7 days Imaging: PACU xrays DVT prophylaxis: Aspirin 81mg  BID x4 weeks Follow up: 2 weeks after surgery for a wound check with Dr. Blanchie Dessert at [facility].  Address: [address], [place], [place] [address]  Office Phone: [phone]  Weber Cooks, MD Orthopaedic Surgery

## 2022-12-19 NOTE — Progress Notes (Signed)
Orthopedic Tech Progress Note Patient Details:  Phyllis Leonard September 30, 1955 [id number]  Ortho Devices Type of Ortho Device: Bone foam zero knee Ortho Device/Splint Location: RLE Ortho Device/Splint Interventions: Ordered, Application, Adjustment   Post Interventions Patient Tolerated: Well Instructions Provided: Care of device, Adjustment of device  [name] 12/19/2022, 5:49 PM

## 2022-12-19 NOTE — H&P (Signed)
TOTAL KNEE REVISION ADMISSION H&P  Patient is being admitted for right revision total knee arthroplasty.  Subjective:  Chief Complaint:right knee pain.  HPI: Phyllis Leonard, 67 y.o. female, has a history of pain and functional disability in the right knee(s) due to arthritis and patient has failed non-surgical conservative treatments for greater than 12 weeks to include NSAID's and/or analgesics, corticosteriod injections, supervised PT with diminished ADL's post treatment, use of assistive devices, and activity modification. The indications for the revision of the total knee arthroplasty are loosening of one or more components. Onset of symptoms was gradual starting 2 years ago with gradually worsening course since that time.  Prior procedures on the right knee(s) include arthroplasty.  Patient currently rates pain in the right knee(s) at 10 out of 10 with activity. There is night pain, worsening of pain with activity and weight bearing, pain that interferes with activities of daily living, and pain with passive range of motion.  Patient has evidence of significant collapse of tibial base plate, with femoral components well fixed by imaging studies. This condition presents safety issues increasing the risk of falls.  There is no current active infection.  Patient Active Problem List   Diagnosis Date Noted   GERD (gastroesophageal reflux disease) 02/02/2019   History of colonic polyps 02/02/2019   Gastroesophageal reflux disease 02/02/2019   Hx of colonic polyps 02/02/2019   Past Medical History:  Diagnosis Date   Arthritis    "all over"   Dental crowns present    Hypercholesterolemia    Hypertension    under control with med., has been on med. x 6-8 yr.   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    Past Surgical History:  Procedure Laterality Date   ABDOMINAL HYSTERECTOMY     partial   BIOPSY  03/31/2019    Procedure: BIOPSY;  Surgeon: Rehman, Najeeb U, MD;  Location: [facility];  Service: Endoscopy;;  duodenum gastric   CHOLECYSTECTOMY     COLONOSCOPY N/A 03/31/2019   Procedure: COLONOSCOPY;  Surgeon: Rehman, Najeeb U, MD;  Location: [facility];  Service: Endoscopy;  Laterality: N/A;   ESOPHAGOGASTRODUODENOSCOPY N/A 03/31/2019   Procedure: ESOPHAGOGASTRODUODENOSCOPY (EGD);  Surgeon: Rehman, Najeeb U, MD;  Location: [facility];  Service: Endoscopy;  Laterality: N/A;  200-office moved to 12:00pm   LUMBAR LAMINECTOMY     prior to 2013 surgery   LUMBAR LAMINECTOMY/DECOMPRESSION MICRODISCECTOMY  04/29/2012   Procedure: LUMBAR LAMINECTOMY/DECOMPRESSION MICRODISCECTOMY 1 LEVEL;  Surgeon: Joseph Stern, MD;  Location: [facility];  Service: Neurosurgery;  Laterality: Left;  Left Lumbar Four-Five Redo Microdiskectomy   POLYPECTOMY  03/31/2019   Procedure: POLYPECTOMY;  Surgeon: Rehman, Najeeb U, MD;  Location: [facility];  Service: Endoscopy;;   SHOULDER ARTHROSCOPY W/ ROTATOR CUFF REPAIR Left    TOTAL KNEE ARTHROPLASTY Bilateral    ULNAR SHORTENING WITH BONE GRAFT Left 01/07/2018   Procedure: Repair ULNAR styloid non-union WITH BONE GRAFT;  Surgeon: Kuzma, Gary, MD;  Location:  SURGERY CENTER;  Service: Orthopedics;  Laterality: Left;  axillary block    Current Outpatient Medications  Medication Sig Dispense Refill Last Dose   acetaminophen (TYLENOL) 650 MG CR tablet Take 1,300 mg by mouth every 8 (eight) hours as needed for pain.      ARTIFICIAL TEAR SOLUTION OP Place 1 drop into both eyes daily as needed (dry eyes).        aspirin EC 81 MG tablet Take 1 tablet (81 mg total) by mouth daily.      atorvastatin (LIPITOR) 80 MG tablet Take 80 mg by mouth every evening.       canagliflozin (INVOKANA) 300 MG TABS tablet Take 300 mg by mouth daily before breakfast.      celecoxib (CELEBREX) 200 MG capsule Take 1 capsule (200 mg total) by mouth daily.      Dulaglutide (TRULICITY) 1.5  MG/0.5ML SOPN Inject 1.5 mg into the skin every Saturday.       DULoxetine (CYMBALTA) 60 MG capsule Take 60 mg by mouth every evening.       famotidine (PEPCID) 20 MG tablet Take 1 tablet (20 mg total) by mouth at bedtime as needed for heartburn or indigestion.      Insulin Glargine, 2 Unit Dial, (TOUJEO MAX SOLOSTAR) 300 UNIT/ML SOPN Inject 65 Units into the skin at bedtime.      losartan (COZAAR) 25 MG tablet Take 50 mg by mouth daily.      metFORMIN (GLUCOPHAGE-XR) 500 MG 24 hr tablet Take 1,000 mg by mouth 2 (two) times daily.      omeprazole (PRILOSEC) 40 MG capsule Take 40 mg by mouth daily.      Vitamin D, Ergocalciferol, (DRISDOL) 1.25 MG (50000 UT) CAPS capsule Take 50,000 Units by mouth every Saturday.      No current facility-administered medications for this visit.   No Known Allergies  Social History   Tobacco Use   Smoking status: Never   Smokeless tobacco: Never  Substance Use Topics   Alcohol use: No    No family history on file.    Review of Systems  Musculoskeletal:  Positive for arthralgias.  All other systems reviewed and are negative.    Objective:  Physical Exam Constitutional:      General: She is not in acute distress.    Appearance: Normal appearance. She is not ill-appearing.  HENT:     Head: Normocephalic and atraumatic.     Right Ear: External ear normal.     Left Ear: External ear normal.     Nose: Nose normal.     Mouth/Throat:     Mouth: Mucous membranes are moist.     Pharynx: Oropharynx is clear.  Eyes:     Extraocular Movements: Extraocular movements intact.     Conjunctiva/sclera: Conjunctivae normal.  Cardiovascular:     Rate and Rhythm: Normal rate and regular rhythm.     Pulses: Normal pulses.     Heart sounds: Normal heart sounds.  Pulmonary:     Effort: Pulmonary effort is normal.     Breath sounds: Normal breath sounds.  Abdominal:     General: Bowel sounds are normal.     Palpations: Abdomen is soft.     Tenderness: There  is no abdominal tenderness.  Musculoskeletal:        General: Tenderness present.     Cervical back: Normal range of motion and neck supple.     Comments: ROM 5-115 of Right knee.  Mild distal medial and lateral joint line tenderness, with some tenderness elicited of posterior aspect of patella, right LE.  Mildly antalgic gait with cane.  Mild well-healing 0.5cm abrasion near the vertical well healed scar from prior right TKA without erythema or drainage.  BLE appear grossly neurovascularly intact.  Skin:    General: Skin is warm and dry.  Neurological:     Mental Status: She is alert and oriented to   person, place, and time. Mental status is at baseline.  Psychiatric:        Mood and Affect: Mood normal.        Behavior: Behavior normal.     Vital signs in last 24 hours: @VSRANGES@  Labs:  Estimated body mass index is 33.13 kg/m as calculated from the following:   Height as of 03/31/19: 5' 4" (1.626 m).   Weight as of 03/31/19: 87.5 kg.  Imaging Review Plain radiographs demonstrate evidence of loosening of the tibial components. The bone quality appears to be good for age and reported activity level. The femoral components appear to remain well fixed.   Assessment/Plan:  End stage arthritis, right knee(s) with failed previous arthroplasty.   The patient history, physical examination, clinical judgment of the provider and imaging studies are consistent with end stage degenerative joint disease of the right knee(s), previous total knee arthroplasty. Revision total knee arthroplasty is deemed medically necessary. The treatment options including medical management, injection therapy, arthroscopy and revision arthroplasty were discussed at length. The risks and benefits of revision total knee arthroplasty were presented and reviewed. The risks due to aseptic loosening, infection, stiffness, patella tracking problems, thromboembolic complications and other imponderables were discussed. The  patient acknowledged the explanation, agreed to proceed with the plan and consent was signed. Patient is being admitted for inpatient treatment for surgery, pain control, PT, OT, prophylactic antibiotics, VTE prophylaxis, progressive ambulation and ADL's and discharge planning. The patient is planning to be admitted to inpatient, with eventual discharge with outpatient PT.   

## 2022-12-19 NOTE — Anesthesia Procedure Notes (Signed)
Anesthesia Regional Block: Adductor canal block   Pre-Anesthetic Checklist: , timeout performed,  Correct Patient, Correct Site, Correct Laterality,  Correct Procedure, Correct Position, site marked,  Risks and benefits discussed,  Pre-op evaluation,  At surgeon's request and post-op pain management  Laterality: Right  Prep: Maximum Sterile Barrier Precautions used, chloraprep       Needles:  Injection technique: Single-shot  Needle Type: Echogenic Stimulator Needle     Needle Length: 9cm  Needle Gauge: 21     Additional Needles:   Procedures:,,,, ultrasound used (permanent image in chart),,    Narrative:  Start time: 12/19/2022 9:00 AM End time: 12/19/2022 9:10 AM Injection made incrementally with aspirations every 5 mL.  Performed by: Personally  Anesthesiologist: Gaynelle Adu, MD

## 2022-12-19 NOTE — Discharge Instructions (Addendum)

## 2022-12-20 LAB — GLUCOSE, CAPILLARY
Glucose-Capillary: 160 mg/dL — ABNORMAL HIGH (ref 70–99)
Glucose-Capillary: 155 mg/dL — ABNORMAL HIGH (ref 70–99)
Glucose-Capillary: 151 mg/dL — ABNORMAL HIGH (ref 70–99)
Glucose-Capillary: 149 mg/dL — ABNORMAL HIGH (ref 70–99)

## 2022-12-20 LAB — BASIC METABOLIC PANEL
Anion gap: 13 (ref 5–15)
BUN: 25 mg/dL — ABNORMAL HIGH (ref 8–23)
CO2: 22 mmol/L (ref 22–32)
Calcium: 9.2 mg/dL (ref 8.9–10.3)
Chloride: 101 mmol/L (ref 98–111)
Creatinine, Ser: 1.2 mg/dL — ABNORMAL HIGH (ref 0.44–1.00)
GFR, Estimated: 50 mL/min — ABNORMAL LOW (ref >60)
Glucose, Bld: 166 mg/dL — ABNORMAL HIGH (ref 70–99)
Potassium: 4.6 mmol/L (ref 3.5–5.1)
Sodium: 136 mmol/L (ref 135–145)

## 2022-12-20 LAB — CBC
HCT: 37.6 % (ref 36.0–46.0)
Hemoglobin: 12.7 g/dL (ref 12.0–15.0)
MCH: 29.7 pg (ref 26.0–34.0)
MCHC: 33.8 g/dL (ref 30.0–36.0)
MCV: 88.1 fL (ref 80.0–100.0)
Platelets: 185 10*3/uL (ref 150–400)
RBC: 4.27 MIL/uL (ref 3.87–5.11)
RDW: 13.8 % (ref 11.5–15.5)
WBC: 14 10*3/uL — ABNORMAL HIGH (ref 4.0–10.5)
nRBC: 0 % (ref 0.0–0.2)

## 2022-12-20 LAB — AEROBIC/ANAEROBIC CULTURE W GRAM STAIN (SURGICAL/DEEP WOUND)

## 2022-12-20 NOTE — Evaluation (Signed)
Physical Therapy Evaluation Patient Details Name: Phyllis Leonard MRN: [id number] DOB: 12/20/1955 Today's Date: 12/20/2022  History of Present Illness  67 y.o. female admitted 6/7 s/p RIGHT TOTAL KNEE REVISION. PMH: GERD     History of colonic polyps    Gastroesophageal reflux disease  Hx of colonic polyps  Clinical Impression  Patient is s/p above surgery resulting in functional limitations due to the deficits listed below (see PT Problem List). Mobilizing well post-op. Ambulating in hallway with light RW support. Safely completed stair training without physical assistance. Reviewed HEP and precautions. Good family support at d/c. Patient will benefit from acute skilled PT to increase their independence and safety with mobility to facilitate discharge.        Recommendations for follow up therapy are one component of a multi-disciplinary discharge planning process, led by the attending physician.  Recommendations may be updated based on patient status, additional functional criteria and insurance authorization.  Follow Up Recommendations       Assistance Recommended at Discharge Set up Supervision/Assistance  Patient can return home with the following  A little help with bathing/dressing/bathroom;Assistance with cooking/housework;Assist for transportation;Help with stairs or ramp for entrance    Equipment Recommendations None recommended by PT  Recommendations for Other Services       Functional Status Assessment Patient has had a recent decline in their functional status and demonstrates the ability to make significant improvements in function in a reasonable and predictable amount of time.     Precautions / Restrictions Precautions Precautions: Knee;Fall Precaution Comments: Reviewed precautions Restrictions Weight Bearing Restrictions: Yes RLE Weight Bearing: Weight bearing as tolerated      Mobility  Bed Mobility Overal bed mobility: Needs Assistance Bed Mobility: Sit to  Supine       Sit to supine: Supervision   General bed mobility comments: in recliner at start of therapy. Pt able use LLE to support RLE into bed with supervision and cues.    Transfers Overall transfer level: Needs assistance Equipment used: Rolling walker (2 wheels) Transfers: Sit to/from Stand Sit to Stand: Supervision           General transfer comment: Supervision for safety. A little slow to rise but stable once upright with UE support on walker. Cues for hand placement.    Ambulation/Gait Ambulation/Gait assistance: Supervision Gait Distance (Feet): 150 Feet Assistive device: Rolling walker (2 wheels) Gait Pattern/deviations: Step-through pattern, Antalgic Gait velocity: decr Gait velocity interpretation: <1.8 ft/sec, indicate of risk for recurrent falls   General Gait Details: Mildly antalgic gait pattern with fair flexion, good heel strike.No buckling noted. Cues for RW use and progression with symmetrical gait.  Stairs Stairs: Yes Stairs assistance: Supervision Stair Management: One rail Left, Step to pattern, Forwards Number of Stairs: 2 General stair comments: Educated on safe stair navigation techniques using set-up similar to home environment. Performed safely without physical assist. husband able to guard to enter home at d/c. feels confident with this task.  Wheelchair Mobility    Modified Rankin (Stroke Patients Only)       Balance Overall balance assessment: Mild deficits observed, not formally tested                                           Pertinent Vitals/Pain Pain Assessment Pain Assessment: 0-10 Pain Score: 7  Pain Location: Rt knee Pain Descriptors / Indicators: Aching Pain Intervention(s): Monitored  during session, Repositioned    Home Living Family/patient expects to be discharged to:: Private residence Living Arrangements: Spouse/significant other Available Help at Discharge: Family;Available 24  hours/day Type of Home: House Home Access: Stairs to enter Entrance Stairs-Rails: Left Entrance Stairs-Number of Steps: 1   Home Layout: One level (has one small step to get into kitchen) Home Equipment: Rollator (4 wheels);Cane - single point;Crutches;BSC/3in1;Grab bars - tub/shower      Prior Function Prior Level of Function : Independent/Modified Independent             Mobility Comments: ind with use of rollator at all times, denies falls ADLs Comments: ind, husband would do majority of cooking though. It was getting more difficult for her to clean the house.     Hand Dominance   Dominant Hand: Right    Extremity/Trunk Assessment   Upper Extremity Assessment Upper Extremity Assessment: Defer to OT evaluation    Lower Extremity Assessment Lower Extremity Assessment: RLE deficits/detail RLE Deficits / Details: SILT, post op guarding as expected RLE: Unable to fully assess due to pain       Communication   Communication: No difficulties  Cognition Arousal/Alertness: Awake/alert Behavior During Therapy: WFL for tasks assessed/performed Overall Cognitive Status: Within Functional Limits for tasks assessed                                          General Comments General comments (skin integrity, edema, etc.): Reviewed precautions and handout.    Exercises Total Joint Exercises Ankle Circles/Pumps: AROM, Both, 5 reps, Supine Quad Sets: Strengthening, Both, 5 reps, Supine Short Arc Quad: Strengthening, Right, 5 reps, Supine Heel Slides: AROM, Right, 5 reps, Supine Hip ABduction/ADduction: Strengthening, Both, 5 reps, Supine Straight Leg Raises: Strengthening, Right, 5 reps, Supine Long Arc Quad:  (Reviewed) Knee Flexion:  (Reviewed to perform EOB)   Assessment/Plan    PT Assessment Patient needs continued PT services  PT Problem List Decreased strength;Decreased range of motion;Decreased activity tolerance;Decreased balance;Decreased  mobility;Decreased knowledge of use of DME;Decreased knowledge of precautions;Pain;Obesity       PT Treatment Interventions DME instruction;Gait training;Stair training;Functional mobility training;Therapeutic activities;Therapeutic exercise;Balance training;Neuromuscular re-education;Patient/family education;Modalities    PT Goals (Current goals can be found in the Care Plan section)  Acute Rehab PT Goals Patient Stated Goal: Get well PT Goal Formulation: With patient Time For Goal Achievement: 12/27/22 Potential to Achieve Goals: Good    Frequency 7X/week     Co-evaluation               AM-PAC PT "6 Clicks" Mobility  Outcome Measure Help needed turning from your back to your side while in a flat bed without using bedrails?: None Help needed moving from lying on your back to sitting on the side of a flat bed without using bedrails?: None Help needed moving to and from a bed to a chair (including a wheelchair)?: A Little Help needed standing up from a chair using your arms (e.g., wheelchair or bedside chair)?: A Little Help needed to walk in hospital room?: A Little Help needed climbing 3-5 steps with a railing? : A Little 6 Click Score: 20    End of Session Equipment Utilized During Treatment: Gait belt Activity Tolerance: Patient tolerated treatment well Patient left: in bed;with call bell/phone within reach;with bed alarm set;with family/visitor present;with SCD's reapplied Nurse Communication: Mobility status PT Visit Diagnosis: Other abnormalities of  gait and mobility (R26.89);Difficulty in walking, not elsewhere classified (R26.2);Pain Pain - Right/Left: Right Pain - part of body: Knee    Time: 1125-1155 PT Time Calculation (min) (ACUTE ONLY): 30 min   Charges:   PT Evaluation $PT Eval Low Complexity: 1 Low PT Treatments $Gait Training: 8-22 mins        Kathlyn Sacramento, PT, DPT [facility]  Rehabilitation Services Physical Therapist Office:  [phone] Website: [email or web address]   Berton Mount 12/20/2022, 1:50 PM

## 2022-12-20 NOTE — Progress Notes (Signed)
Orthopaedic Trauma Progress Note  SUBJECTIVE: Patient doing okay this morning.  Pain controlled.  Has had a few episodes of nausea and vomiting.  Zofran is helping with this.  Feels that it is likely related to taking oxycodone with no food.  No chest pain. No SOB. No nausea/vomiting. No other complaints.  Has not been up out of bed yet since surgery.  Intraoperative cultures pending  OBJECTIVE:  Vitals:   12/20/22 0439 12/20/22 0727  BP: (!) 141/70 138/75  Pulse:    Resp: 16 17  Temp: 98.2 F (36.8 C) 98.1 F (36.7 C)  SpO2: 100% 96%    General: Sitting up in bed, no acute distress.  Pleasant and cooperative. Respiratory: No increased work of breathing.  Right lower extremity: Dressing clean, dry, intact.  No output in Prevena wound VAC canister.  Ankle DF/PF intact.  Endorses sensation to all aspects of the foot and ankle.  No significant calf tenderness.  Minimal to mild tenderness over the distal thigh just above the knee.  2+ DP pulse  IMAGING: Stable post op imaging.   LABS:  Results for orders placed or performed during the hospital encounter of 12/19/22 (from the past 24 hour(s))  Type and screen [facility]     Status: None   Collection Time: 12/19/22  8:48 AM  Result Value Ref Range   ABO/RH(D) O POS    Antibody Screen NEG    Sample Expiration      12/22/2022,2359 Performed at [facility], [address] [place], [place] [address]   Glucose, capillary     Status: Abnormal   Collection Time: 12/19/22  8:49 AM  Result Value Ref Range   Glucose-Capillary 128 (H) 70 - 99 mg/dL  Aerobic/Anaerobic Culture w Gram Stain (surgical/deep wound)     Status: None (Preliminary result)   Collection Time: 12/19/22 10:40 AM   Specimen: Soft Tissue, Other  Result Value Ref Range   Specimen Description TISSUE RIGHT KNEE    Special Requests SAMPLE NO 1 PT ON ANCEF    Gram Stain      NO WBC SEEN NO ORGANISMS SEEN Performed at [facility], [address], [place], [place] [address]    Culture PENDING    Report Status PENDING   Aerobic/Anaerobic Culture w Gram Stain (surgical/deep wound)     Status: None (Preliminary result)   Collection Time: 12/19/22 10:42 AM   Specimen: Soft Tissue, Other  Result Value Ref Range   Specimen Description TISSUE RIGHT KNEE    Special Requests SAMPLE NO 2, PT ON ANCEF    Gram Stain      RARE WBC SEEN NO ORGANISMS SEEN Performed at [facility], [address] [place], [place] [address]    Culture PENDING    Report Status PENDING   Glucose, capillary     Status: Abnormal   Collection Time: 12/19/22  2:39 PM  Result Value Ref Range   Glucose-Capillary 197 (H) 70 - 99 mg/dL  Glucose, capillary     Status: Abnormal   Collection Time: 12/19/22  4:58 PM  Result Value Ref Range   Glucose-Capillary 219 (H) 70 - 99 mg/dL  Hemoglobin B1Y     Status: Abnormal   Collection Time: 12/19/22  6:02 PM  Result Value Ref Range   Hgb A1c MFr Bld 5.9 (H) 4.8 - 5.6 %   Mean Plasma Glucose 122.63 mg/dL  Glucose, capillary     Status: Abnormal  Collection Time: 12/19/22  9:46 PM  Result Value Ref Range   Glucose-Capillary 173 (H) 70 - 99 mg/dL  CBC     Status: Abnormal   Collection Time: 12/20/22  1:32 AM  Result Value Ref Range   WBC 14.0 (H) 4.0 - 10.5 K/uL   RBC 4.27 3.87 - 5.11 MIL/uL   Hemoglobin 12.7 12.0 - 15.0 g/dL   HCT 40.9 81.1 - 91.4 %   MCV 88.1 80.0 - 100.0 fL   MCH 29.7 26.0 - 34.0 pg   MCHC 33.8 30.0 - 36.0 g/dL   RDW 78.2 95.6 - 21.3 %   Platelets 185 150 - 400 K/uL   nRBC 0.0 0.0 - 0.2 %  Basic metabolic panel     Status: Abnormal   Collection Time: 12/20/22  1:32 AM  Result Value Ref Range   Sodium 136 135 - 145 mmol/L   Potassium 4.6 3.5 - 5.1 mmol/L   Chloride 101 98 - 111 mmol/L   CO2 22 22 - 32 mmol/L   Glucose, Bld 166 (H) 70 - 99 mg/dL   BUN 25 (H) 8 - 23 mg/dL   Creatinine, Ser 0.86 (H) 0.44 - 1.00 mg/dL   Calcium 9.2 8.9 - 57.8 mg/dL   GFR, Estimated 50 (L) >60  mL/min   Anion gap 13 5 - 15  Glucose, capillary     Status: Abnormal   Collection Time: 12/20/22  7:25 AM  Result Value Ref Range   Glucose-Capillary 160 (H) 70 - 99 mg/dL    ASSESSMENT: Phyllis Leonard is a 67 y.o. female, 1 Day Post-Op s/p RIGHT TOTAL KNEE REVISION  CV/Blood loss: Hemoglobin 12.7 this morning, relatively stable from preoperative exam 2 weeks ago.  Hemodynamically stable  PLAN: Weightbearing: WBAT RLE ROM: Okay for knee ROM as tolerated Incisional and dressing care: Dressings left intact until follow-up  Showering: Avoid getting RLE wet Orthopedic device(s): Wound Vac: Right knee   Pain management: Continue current multimodal pain control VTE prophylaxis: Aspirin, SCDs ID:  Ancef 2gm post op Foley/Lines:  No foley, KVO IVFs Dispo: PT/OT evaluation today, dispo pending.  Continue to follow intraoperative cultures.  Will plan to discharge on cefadroxil 500 mg twice daily x 7 days.  Will continue aspirin 81 mg twice daily x 4 weeks for DVT prophylaxis   Follow - up plan: 1 week with Dr. Margie Ege information:  Truitt Merle MD, Thyra Breed PA-C. After hours and holidays please check [email or web address] for group call information for [facility]   Thompson Caul, PA-C [phone] (office) [email or web address]

## 2022-12-20 NOTE — Progress Notes (Signed)
Physical Therapy Treatment Patient Details Name: Phyllis Leonard MRN: [id number] DOB: [birth year] Today's Date: 12/20/2022   History of Present Illness 67 y.o. female admitted 6/7 s/p RIGHT TOTAL KNEE REVISION. PMH: GERD     History of colonic polyps    Gastroesophageal reflux disease  Hx of colonic polyps    PT Comments    Tolerated treatment well. A little more sore this afternoon but able to ambulate up to 100 feet in hall + additional bout to rest room with husband and PT supervision using RW for support. No buckling noted. Reviewed HEP, ROM activities and precautions for knee extension to optimize healing and outcomes. Patient will continue to benefit from skilled physical therapy services to further improve independence with functional mobility.    Recommendations for follow up therapy are one component of a multi-disciplinary discharge planning process, led by the attending physician.  Recommendations may be updated based on patient status, additional functional criteria and insurance authorization.     Assistance Recommended at Discharge Set up Supervision/Assistance  Patient can return home with the following A little help with bathing/dressing/bathroom;Assistance with cooking/housework;Assist for transportation;Help with stairs or ramp for entrance   Equipment Recommendations  None recommended by PT    Recommendations for Other Services       Precautions / Restrictions Precautions Precautions: Knee;Fall Precaution Comments: Reviewed precautions Restrictions Weight Bearing Restrictions: Yes RLE Weight Bearing: Weight bearing as tolerated     Mobility  Bed Mobility Overal bed mobility: Needs Assistance Bed Mobility: Sit to Supine, Supine to Sit     Supine to sit: Supervision Sit to supine: Supervision   General bed mobility comments: Supervision for safety in/out of bed without physical assist. Requires extra time. uses LLE to support RLE.    Transfers Overall  transfer level: Needs assistance Equipment used: Rolling walker (2 wheels) Transfers: Sit to/from Stand Sit to Stand: Supervision           General transfer comment: Supervision for sit<>stand from bed x2 and toilet x1. No physical assist needed. Cues for hand placement.    Ambulation/Gait Ambulation/Gait assistance: Supervision Gait Distance (Feet): 110 Feet (+25) Assistive device: Rolling walker (2 wheels) Gait Pattern/deviations: Step-through pattern, Antalgic Gait velocity: decr Gait velocity interpretation: <1.8 ft/sec, indicate of risk for recurrent falls   General Gait Details: Good RW control, cues for safety with turns, no buckling noted however. Shorter distance this afternoon due to soreness. Otherwise tolerated well. Supervision for safety. Husband assisted to rest room with PT supervision. Cues for techniques.   Stairs Stairs: Yes Stairs assistance: Supervision Stair Management: One rail Left, Step to pattern, Forwards Number of Stairs: 2 General stair comments: Declines feels confident.   Wheelchair Mobility    Modified Rankin (Stroke Patients Only)       Balance Overall balance assessment: Mild deficits observed, not formally tested                                          Cognition Arousal/Alertness: Awake/alert Behavior During Therapy: WFL for tasks assessed/performed Overall Cognitive Status: Within Functional Limits for tasks assessed                                          Exercises Total Joint Exercises Ankle Circles/Pumps: AROM, Both, 5 reps, Supine  Quad Sets: Strengthening, Both, 5 reps, Supine Short Arc Quad: Strengthening, Right, 5 reps, Supine Heel Slides: AROM, Right, 5 reps, Supine Hip ABduction/ADduction: Strengthening, Both, 5 reps, Supine Straight Leg Raises: Strengthening, Right, 5 reps, Supine Long Arc Quad: AROM, Strengthening, Right, 5 reps, Seated Knee Flexion: AROM, Right, 5 reps,  Seated    General Comments General comments (skin integrity, edema, etc.): Husband prseent, supportive, Ice applied to Rt knee end of session, in bone foam.      Pertinent Vitals/Pain Pain Assessment Pain Assessment: 0-10 Pain Score: 8  Pain Location: Rt knee Pain Descriptors / Indicators: Sore Pain Intervention(s): Monitored during session, Repositioned    Home Living                          Prior Function            PT Goals (current goals can now be found in the care plan section) Acute Rehab PT Goals Patient Stated Goal: Get well PT Goal Formulation: With patient Time For Goal Achievement: 12/27/22 Potential to Achieve Goals: Good Progress towards PT goals: Progressing toward goals    Frequency    7X/week      PT Plan Current plan remains appropriate    Co-evaluation              AM-PAC PT "6 Clicks" Mobility   Outcome Measure  Help needed turning from your back to your side while in a flat bed without using bedrails?: None Help needed moving from lying on your back to sitting on the side of a flat bed without using bedrails?: None Help needed moving to and from a bed to a chair (including a wheelchair)?: A Little Help needed standing up from a chair using your arms (e.g., wheelchair or bedside chair)?: A Little Help needed to walk in hospital room?: A Little Help needed climbing 3-5 steps with a railing? : A Little 6 Click Score: 20    End of Session Equipment Utilized During Treatment: Gait belt Activity Tolerance: Patient tolerated treatment well Patient left: in bed;with call bell/phone within reach;with bed alarm set;with family/visitor present;with SCD's reapplied Nurse Communication: Mobility status PT Visit Diagnosis: Other abnormalities of gait and mobility (R26.89);Difficulty in walking, not elsewhere classified (R26.2);Pain Pain - Right/Left: Right Pain - part of body: Knee     Time: 1478-2956 PT Time Calculation (min)  (ACUTE ONLY): 23 min  Charges:  $Gait Training: 8-22 mins $Therapeutic Activity: 8-22 mins                     Kathlyn Sacramento, PT, DPT [facility]  Rehabilitation Services Physical Therapist Office: [phone] Website: [email or web address]    Berton Mount 12/20/2022, 5:23 PM

## 2022-12-21 LAB — CBC
HCT: 37.5 % (ref 36.0–46.0)
Hemoglobin: 12.2 g/dL (ref 12.0–15.0)
MCH: 29.7 pg (ref 26.0–34.0)
MCHC: 32.5 g/dL (ref 30.0–36.0)
MCV: 91.2 fL (ref 80.0–100.0)
Platelets: 180 10*3/uL (ref 150–400)
RBC: 4.11 MIL/uL (ref 3.87–5.11)
RDW: 13.8 % (ref 11.5–15.5)
WBC: 9.8 10*3/uL (ref 4.0–10.5)
nRBC: 0 % (ref 0.0–0.2)

## 2022-12-21 LAB — GLUCOSE, CAPILLARY
Glucose-Capillary: 118 mg/dL — ABNORMAL HIGH (ref 70–99)
Glucose-Capillary: 138 mg/dL — ABNORMAL HIGH (ref 70–99)
Glucose-Capillary: 125 mg/dL — ABNORMAL HIGH (ref 70–99)
Glucose-Capillary: 107 mg/dL — ABNORMAL HIGH (ref 70–99)

## 2022-12-21 LAB — AEROBIC/ANAEROBIC CULTURE W GRAM STAIN (SURGICAL/DEEP WOUND)

## 2022-12-21 MED ORDER — ACETAMINOPHEN 500 MG PO TABS: 500.0000 mg | ORAL_TABLET | Freq: Four times a day (QID) | ORAL | Status: DC | PRN

## 2022-12-21 NOTE — Progress Notes (Signed)
Orthopaedic Trauma Progress Note  SUBJECTIVE: Patient doing fairly well today. Pain controlled as long as she stays on regular medication regimen.  Is nervous about discharging home today and pain not being adequately controlled on oral meds alone. Still requiring IV meds. No chest pain. No SOB. No nausea/vomiting. No other complaints.  Progressing well with therapies.  Intraoperative cultures NGTD. Husband at bedside  OBJECTIVE:  Vitals:   12/21/22 0710 12/21/22 0743  BP:  (!) 160/70  Pulse:    Resp:  17  Temp:  98.4 F (36.9 C)  SpO2: 98% 95%    General: Sitting up in bed, no acute distress.  Pleasant and cooperative. Respiratory: No increased work of breathing.  Right lower extremity: Dressing clean, dry, intact.  No output in Prevena wound VAC canister.  Ankle DF/PF intact.  Endorses sensation to all aspects of the foot and ankle.  No significant calf tenderness.  Minimal to mild tenderness over the distal thigh just above the knee.  2+ DP pulse  IMAGING: Stable post op imaging.   LABS:  Results for orders placed or performed during the hospital encounter of 12/19/22 (from the past 24 hour(s))  Glucose, capillary     Status: Abnormal   Collection Time: 12/20/22  4:42 PM  Result Value Ref Range   Glucose-Capillary 151 (H) 70 - 99 mg/dL  Glucose, capillary     Status: Abnormal   Collection Time: 12/20/22  7:20 PM  Result Value Ref Range   Glucose-Capillary 149 (H) 70 - 99 mg/dL  CBC     Status: None   Collection Time: 12/21/22  1:40 AM  Result Value Ref Range   WBC 9.8 4.0 - 10.5 K/uL   RBC 4.11 3.87 - 5.11 MIL/uL   Hemoglobin 12.2 12.0 - 15.0 g/dL   HCT 16.1 09.6 - 04.5 %   MCV 91.2 80.0 - 100.0 fL   MCH 29.7 26.0 - 34.0 pg   MCHC 32.5 30.0 - 36.0 g/dL   RDW 40.9 81.1 - 91.4 %   Platelets 180 150 - 400 K/uL   nRBC 0.0 0.0 - 0.2 %  Glucose, capillary     Status: Abnormal   Collection Time: 12/21/22  7:41 AM  Result Value Ref Range   Glucose-Capillary 118 (H) 70 - 99  mg/dL  Glucose, capillary     Status: Abnormal   Collection Time: 12/21/22 12:05 PM  Result Value Ref Range   Glucose-Capillary 138 (H) 70 - 99 mg/dL    ASSESSMENT: Phyllis Leonard is a 67 y.o. female, 2 Days Post-Op s/p RIGHT TOTAL KNEE REVISION  CV/Blood loss: Hemoglobin 12.2 this AM. Stable.  Hemodynamically stable  PLAN: Weightbearing: WBAT RLE ROM: Okay for knee ROM as tolerated Incisional and dressing care: Dressings left intact until follow-up  Showering: Avoid getting RLE wet Orthopedic device(s): Wound Vac: Right knee   Pain management: Continue current multimodal pain control. Try to limit use of IV meds as able VTE prophylaxis: Aspirin, SCDs ID:  Ancef 2gm post op Foley/Lines:  No foley, KVO IVFs Dispo: PT/OT. Plan for discharge home tomorrow 12/22/22. Continue to follow intraoperative cultures.  Will plan to discharge on cefadroxil 500 mg twice daily x 7 days.  Will continue aspirin 81 mg twice daily x 4 weeks for DVT prophylaxis   Follow - up plan: 1 week with Dr. Margie Ege information:  Truitt Merle MD, Thyra Breed PA-C. After hours and holidays please check [email or web address] for group call information for [facility]   Thompson Caul, PA-C [phone] (office) [email or web address]

## 2022-12-21 NOTE — Progress Notes (Signed)
Physical Therapy Treatment Patient Details Name: Phyllis Leonard MRN: [id number] DOB: 28-Dec-1955 Today's Date: 12/21/2022   History of Present Illness 67 y.o. female admitted 6/7 s/p RIGHT TOTAL KNEE REVISION. PMH: GERD     History of colonic polyps    Gastroesophageal reflux disease  Hx of colonic polyps    PT Comments    Continuing work on functional mobility and activity tolerance;  Session focused on progressive amb, review of stair training in prep for dc home today; overall managing well with husband's prn assist; discussed car transfer; OK for dc home from PT standpoint   Recommendations for follow up therapy are one component of a multi-disciplinary discharge planning process, led by the attending physician.  Recommendations may be updated based on patient status, additional functional criteria and insurance authorization.  Follow Up Recommendations       Assistance Recommended at Discharge Set up Supervision/Assistance  Patient can return home with the following A little help with bathing/dressing/bathroom;Assistance with cooking/housework;Assist for transportation;Help with stairs or ramp for entrance   Equipment Recommendations  None recommended by PT    Recommendations for Other Services       Precautions / Restrictions Precautions Precautions: Knee;Fall Precaution Comments: Pt educated to not allow any pillow or bolster under knee for healing with optimal range of motion.  Restrictions RLE Weight Bearing: Weight bearing as tolerated     Mobility  Bed Mobility Overal bed mobility: Needs Assistance Bed Mobility: Supine to Sit     Supine to sit: Supervision     General bed mobility comments: Supervision for safety in/out of bed without physical assist. Requires extra time. uses LLE to support RLE.    Transfers Overall transfer level: Needs assistance Equipment used: Rolling walker (2 wheels) Transfers: Sit to/from Stand Sit to Stand: Min guard            General transfer comment: cues for hand placement and safety    Ambulation/Gait Ambulation/Gait assistance: Supervision Gait Distance (Feet): 145 Feet Assistive device: Rolling walker (2 wheels) Gait Pattern/deviations: Step-through pattern, Antalgic Gait velocity: decr     General Gait Details: Nice, stable knee in stance   Stairs Stairs: Yes Stairs assistance: Min assist Stair Management: One rail Left, Forwards (and RUE suppport given by husband) Number of Stairs: 2 General stair comments: Practiced with pt's husband present; managing well   Wheelchair Mobility    Modified Rankin (Stroke Patients Only)       Balance Overall balance assessment: Mild deficits observed, not formally tested                                          Cognition Arousal/Alertness: Awake/alert, Suspect due to medications (eyes drifted closed some) Behavior During Therapy: WFL for tasks assessed/performed Overall Cognitive Status: Within Functional Limits for tasks assessed                                          Exercises      General Comments General comments (skin integrity, edema, etc.): Husband present and helpful      Pertinent Vitals/Pain Pain Assessment Pain Assessment: 0-10 Pain Score: 6  Pain Location: Rt knee Pain Descriptors / Indicators: Sore Pain Intervention(s): Premedicated before session    Home Living  Prior Function            PT Goals (current goals can now be found in the care plan section) Acute Rehab PT Goals Patient Stated Goal: Get well PT Goal Formulation: With patient Time For Goal Achievement: 12/27/22 Potential to Achieve Goals: Good Progress towards PT goals: Progressing toward goals    Frequency           PT Plan Current plan remains appropriate    Co-evaluation              AM-PAC PT "6 Clicks" Mobility   Outcome Measure  Help needed turning from  your back to your side while in a flat bed without using bedrails?: None Help needed moving from lying on your back to sitting on the side of a flat bed without using bedrails?: None Help needed moving to and from a bed to a chair (including a wheelchair)?: A Little Help needed standing up from a chair using your arms (e.g., wheelchair or bedside chair)?: A Little Help needed to walk in hospital room?: A Little Help needed climbing 3-5 steps with a railing? : A Little 6 Click Score: 20    End of Session Equipment Utilized During Treatment: Gait belt Activity Tolerance: Patient tolerated treatment well Patient left: in chair;with call bell/phone within reach;with family/visitor present Nurse Communication: Mobility status PT Visit Diagnosis: Other abnormalities of gait and mobility (R26.89);Difficulty in walking, not elsewhere classified (R26.2);Pain Pain - Right/Left: Right Pain - part of body: Knee     Time: 9604-5409 PT Time Calculation (min) (ACUTE ONLY): 41 min  Charges:  $Gait Training: 23-37 mins $Therapeutic Activity: 8-22 mins                     Van Clines, PT  Acute Rehabilitation Services Office [phone] Secure Chat welcomed    Phyllis Leonard 12/21/2022, 1:00 PM

## 2022-12-22 ENCOUNTER — Encounter (HOSPITAL_COMMUNITY): Payer: Self-pay | Admitting: Orthopedic Surgery

## 2022-12-22 LAB — BASIC METABOLIC PANEL
Anion gap: 12 (ref 5–15)
BUN: 15 mg/dL (ref 8–23)
CO2: 24 mmol/L (ref 22–32)
Calcium: 8.8 mg/dL — ABNORMAL LOW (ref 8.9–10.3)
Chloride: 97 mmol/L — ABNORMAL LOW (ref 98–111)
Creatinine, Ser: 0.96 mg/dL (ref 0.44–1.00)
GFR, Estimated: >60 mL/min (ref >60)
Glucose, Bld: 128 mg/dL — ABNORMAL HIGH (ref 70–99)
Potassium: 4.1 mmol/L (ref 3.5–5.1)
Sodium: 133 mmol/L — ABNORMAL LOW (ref 135–145)

## 2022-12-22 LAB — CBC
HCT: 33.8 % — ABNORMAL LOW (ref 36.0–46.0)
Hemoglobin: 11.2 g/dL — ABNORMAL LOW (ref 12.0–15.0)
MCH: 30.2 pg (ref 26.0–34.0)
MCHC: 33.1 g/dL (ref 30.0–36.0)
MCV: 91.1 fL (ref 80.0–100.0)
Platelets: 155 10*3/uL (ref 150–400)
RBC: 3.71 MIL/uL — ABNORMAL LOW (ref 3.87–5.11)
RDW: 13.7 % (ref 11.5–15.5)
WBC: 9 10*3/uL (ref 4.0–10.5)
nRBC: 0 % (ref 0.0–0.2)

## 2022-12-22 LAB — GLUCOSE, CAPILLARY: Glucose-Capillary: 125 mg/dL — ABNORMAL HIGH (ref 70–99)

## 2022-12-22 NOTE — Discharge Summary (Signed)
Physician Discharge Summary  Patient ID: Phyllis Leonard MRN: [id number] DOB/AGE: [birth year] 67 y.o.  Admit date: 12/19/2022 Discharge date: 12/22/2022  Admission Diagnoses:  Failed total knee arthroplasty [facility])  Discharge Diagnoses:  Principal Problem:   Failed total knee arthroplasty [facility])   Past Medical History:  Diagnosis Date   Arthritis    "all over"   Dental crowns present    History of kidney stones    Hypercholesterolemia    Hypertension    under control with med., has been on med. x 6-8 yr.   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 female who was admitted 12/19/2022 with a diagnosis of Failed total knee arthroplasty (HCC) and went to the operating room on 12/19/2022 and underwent the above named procedures.    She was given perioperative antibiotics:  Anti-infectives (From admission, onward)    Start     Dose/Rate Route Frequency Ordered Stop   12/19/22 2200  ceFAZolin (ANCEF) IVPB 2g/100 mL premix        2 g 200 mL/hr over 30 Minutes Intravenous Every 8 hours 12/19/22 1702 12/22/22 2159   12/19/22 1745  ceFAZolin (ANCEF) IVPB 2g/100 mL premix  Status:  Discontinued        2 g 200 mL/hr over 30 Minutes Intravenous Every 6 hours 12/19/22 1650 12/19/22 1659   12/19/22 0745  ceFAZolin (ANCEF) IVPB 2g/100 mL premix        2 g 200 mL/hr over 30 Minutes Intravenous On call to O.R. 12/19/22 0739 12/19/22 1350   12/19/22 0743  ceFAZolin (ANCEF) 2-4 GM/100ML-% IVPB       Note to Pharmacy: Darrick Huntsman: cabinet override      12/19/22 0743 12/19/22 0956   12/19/22 0000  cefadroxil (DURICEF) 500 MG capsule        500 mg Oral 2 times daily 12/19/22 1635 12/26/22 2359      .  She was given sequential compression devices, early ambulation, and aspirin for DVT prophylaxis.  She benefited maximally from the hospital stay and there were no complications.    Recent vital signs:  Vitals:   12/21/22 2003 12/22/22 0501  BP: (!) 155/71 128/66  Pulse: 96 94  Resp: 20 16  Temp: 98.9 F (37.2 C) 98.5 F (36.9 C)  SpO2: 97% 94%    Recent laboratory studies:  Lab Results  Component Value Date   HGB 12.2 12/21/2022   HGB 12.7 12/20/2022   HGB 13.4 12/02/2022   Lab Results  Component Value Date   WBC 9.8 12/21/2022   PLT 180 12/21/2022   No results found for: "INR" Lab Results  Component Value Date   NA 136 12/20/2022   K 4.6 12/20/2022   CL 101 12/20/2022   CO2 22 12/20/2022   BUN 25 (H) 12/20/2022   CREATININE 1.20 (H) 12/20/2022   GLUCOSE 166 (H) 12/20/2022    Discharge Medications:   Allergies as of 12/22/2022   No Known Allergies      Medication List     TAKE these medications    acetaminophen 650 MG CR tablet Commonly known as: TYLENOL Take 1,300 mg by mouth every 8 (eight) hours as needed for pain.  What changed: Another medication with the same name was added. Make sure you understand how and when to take each.   acetaminophen 500 MG tablet Commonly known as: TYLENOL Take 2 tablets (1,000 mg total) by mouth every 8 (eight) hours as needed. What changed: You were already taking a medication with the same name, and this prescription was added. Make sure you understand how and when to take each.   ARTIFICIAL TEAR SOLUTION OP Place 1 drop into both eyes daily as needed (dry eyes).   aspirin EC 81 MG tablet Take 1 tablet (81 mg total) by mouth 2 (two) times daily for 28 days. Swallow whole. What changed:  when to take this additional instructions   atorvastatin 80 MG tablet Commonly known as: LIPITOR Take 80 mg by mouth every evening.   cefadroxil 500 MG capsule Commonly known as: DURICEF Take 1 capsule (500 mg total) by  mouth 2 (two) times daily for 7 days.   DULoxetine 60 MG capsule Commonly known as: CYMBALTA Take 60 mg by mouth every evening.   famotidine 20 MG tablet Commonly known as: Pepcid Take 1 tablet (20 mg total) by mouth at bedtime as needed for heartburn or indigestion.   Fiasp FlexTouch 100 UNIT/ML FlexTouch Pen Generic drug: insulin aspart Inject 26-30 Units into the skin See admin instructions. Inject 26 units in the morning and 30 units in the evening with food   losartan 50 MG tablet Commonly known as: COZAAR Take 50 mg by mouth daily.   meloxicam 15 MG tablet Commonly known as: MOBIC Take 15 mg by mouth daily.   methocarbamol 500 MG tablet Commonly known as: ROBAXIN Take 1 tablet (500 mg total) by mouth every 8 (eight) hours as needed for up to 10 days for muscle spasms.   omeprazole 40 MG capsule Commonly known as: PRILOSEC Take 40 mg by mouth daily.   ondansetron 4 MG tablet Commonly known as: Zofran Take 1 tablet (4 mg total) by mouth every 8 (eight) hours as needed for up to 14 days for nausea or vomiting.   oxyCODONE 5 MG immediate release tablet Commonly known as: Roxicodone Take 1 tablet (5 mg total) by mouth every 4 (four) hours as needed for up to 7 days for severe pain or moderate pain.   oxymetazoline 0.05 % nasal spray Commonly known as: AFRIN Place 1 spray into both nostrils 2 (two) times daily.   Ozempic (1 MG/DOSE) 4 MG/3ML Sopn Generic drug: Semaglutide (1 MG/DOSE) Inject 1 mg into the skin every 7 (seven) days. Saturday   pioglitazone 15 MG tablet Commonly known as: ACTOS Take 15 mg by mouth daily.   Toujeo Max SoloStar 300 UNIT/ML Solostar Pen Generic drug: insulin glargine (2 Unit Dial) Inject 56 Units into the skin in the morning and at bedtime.   Vitamin D (Ergocalciferol) 1.25 MG (50000 UNIT) Caps capsule Commonly known as: DRISDOL Take 50,000 Units by mouth every Saturday.        Diagnostic Studies: DG Knee Right Port  Result  Date: 12/19/2022 CLINICAL DATA:  Postoperative state. EXAM: PORTABLE RIGHT KNEE - 1-2 VIEW COMPARISON:  None Available. FINDINGS: Right knee arthroplasty, suspected revision arthroplasty, in expected alignment. No periprosthetic lucency or fracture. There has been patellar resurfacing. Recent postsurgical change includes air and edema in the soft tissues and joint space. Wound VAC in place anteriorly. IMPRESSION: Expected postoperative appearance of the right knee arthroplasty. No immediate postoperative complication. Electronically Signed   By: Ivette Loyal.D.  On: 12/19/2022 15:52    Disposition: Discharge disposition: 01-Home or Self Care       Discharge Instructions     Call MD / Call 911   Complete by: As directed    If you experience chest pain or shortness of breath, CALL 911 and be transported to the hospital emergency room.  If you develope a fever above 101 F, pus (white drainage) or increased drainage or redness at the wound, or calf pain, call your surgeon's office.   Constipation Prevention   Complete by: As directed    Drink plenty of fluids.  Prune juice may be helpful.  You may use a stool softener, such as Colace (over the counter) 100 mg twice a day.  Use MiraLax (over the counter) for constipation as needed.   Diet - low sodium heart healthy   Complete by: As directed    Do not put a pillow under the knee. Place it under the heel.   Complete by: As directed    Increase activity slowly as tolerated   Complete by: As directed    Post-operative opioid taper instructions:   Complete by: As directed    POST-OPERATIVE OPIOID TAPER INSTRUCTIONS: It is important to wean off of your opioid medication as soon as possible. If you do not need pain medication after your surgery it is ok to stop day one. Opioids include: Codeine, Hydrocodone(Norco, Vicodin), Oxycodone(Percocet, oxycontin) and hydromorphone amongst others.  Long term and even short term use of opiods can  cause: Increased pain response Dependence Constipation Depression Respiratory depression And more.  Withdrawal symptoms can include Flu like symptoms Nausea, vomiting And more Techniques to manage these symptoms Hydrate well Eat regular healthy meals Stay active Use relaxation techniques(deep breathing, meditating, yoga) Do Not substitute Alcohol to help with tapering If you have been on opioids for less than two weeks and do not have pain than it is ok to stop all together.  Plan to wean off of opioids This plan should start within one week post op of your joint replacement. Maintain the same interval or time between taking each dose and first decrease the dose.  Cut the total daily intake of opioids by one tablet each day Next start to increase the time between doses. The last dose that should be eliminated is the evening dose.           Follow-up Information     Joen Laura, MD Follow up in 1 week(s).   Specialty: Orthopedic Surgery Contact information: [address] [place] [address] [phone]                    Discharge Instructions      INSTRUCTIONS AFTER JOINT REPLACEMENT   Remove items at home which could result in a fall. This includes throw rugs or furniture in walking pathways ICE to the affected joint every three hours while awake for 30 minutes at a time, for at least the first 3-5 days, and then as needed for pain and swelling.  Continue to use ice for pain and swelling. You may notice swelling that will progress down to the foot and ankle.  This is normal after surgery.  Elevate your leg when you are not up walking on it.   Continue to use the breathing machine you got in the hospital (incentive spirometer) which will help keep your temperature down.  It is common for your temperature to cycle up and down following  surgery, especially at night when you are not up moving around and exerting yourself.  The breathing  machine keeps your lungs expanded and your temperature down.   DIET:  As you were doing prior to hospitalization, we recommend a well-balanced diet.  DRESSING / WOUND CARE / SHOWERING  Keep the surgical dressing until follow up.  The dressing is water proof, so you can shower without any extra covering.  IF THE DRESSING FALLS OFF or the wound gets wet inside, change the dressing with sterile gauze.  Please use good hand washing techniques before changing the dressing.  Do not use any lotions or creams on the incision until instructed by your surgeon.    ACTIVITY  Increase activity slowly as tolerated, but follow the weight bearing instructions below.   No driving for 6 weeks or until further direction given by your physician.  You cannot drive while taking narcotics.  No lifting or carrying greater than 10 lbs. until further directed by your surgeon. Avoid periods of inactivity such as sitting longer than an hour when not asleep. This helps prevent blood clots.  You may return to work once you are authorized by your doctor.     WEIGHT BEARING   Weight bearing as tolerated with assist device (walker, cane, etc) as directed, use it as long as suggested by your surgeon or therapist, typically at least 4-6 weeks.   EXERCISES  Results after joint replacement surgery are often greatly improved when you follow the exercise, range of motion and muscle strengthening exercises prescribed by your doctor. Safety measures are also important to protect the joint from further injury. Any time any of these exercises cause you to have increased pain or swelling, decrease what you are doing until you are comfortable again and then slowly increase them. If you have problems or questions, call your caregiver or physical therapist for advice.   Rehabilitation is important following a joint replacement. After just a few days of immobilization, the muscles of the leg can become weakened and shrink (atrophy).   These exercises are designed to build up the tone and strength of the thigh and leg muscles and to improve motion. Often times heat used for twenty to thirty minutes before working out will loosen up your tissues and help with improving the range of motion but do not use heat for the first two weeks following surgery (sometimes heat can increase post-operative swelling).   These exercises can be done on a training (exercise) mat, on the floor, on a table or on a bed. Use whatever works the best and is most comfortable for you.    Use music or television while you are exercising so that the exercises are a pleasant break in your day. This will make your life better with the exercises acting as a break in your routine that you can look forward to.   Perform all exercises about fifteen times, three times per day or as directed.  You should exercise both the operative leg and the other leg as well.  Exercises include:   Quad Sets - Tighten up the muscle on the front of the thigh (Quad) and hold for 5-10 seconds.   Straight Leg Raises - With your knee straight (if you were given a brace, keep it on), lift the leg to 60 degrees, hold for 3 seconds, and slowly lower the leg.  Perform this exercise against resistance later as your leg gets stronger.  Leg Slides: Lying on your back, slowly  slide your foot toward your buttocks, bending your knee up off the floor (only go as far as is comfortable). Then slowly slide your foot back down until your leg is flat on the floor again.  Angel Wings: Lying on your back spread your legs to the side as far apart as you can without causing discomfort.  Hamstring Strength:  Lying on your back, push your heel against the floor with your leg straight by tightening up the muscles of your buttocks.  Repeat, but this time bend your knee to a comfortable angle, and push your heel against the floor.  You may put a pillow under the heel to make it more comfortable if necessary.   A  rehabilitation program following joint replacement surgery can speed recovery and prevent re-injury in the future due to weakened muscles. Contact your doctor or a physical therapist for more information on knee rehabilitation.    CONSTIPATION  Constipation is defined medically as fewer than three stools per week and severe constipation as less than one stool per week.  Even if you have a regular bowel pattern at home, your normal regimen is likely to be disrupted due to multiple reasons following surgery.  Combination of anesthesia, postoperative narcotics, change in appetite and fluid intake all can affect your bowels.   YOU MUST use at least one of the following options; they are listed in order of increasing strength to get the job done.  They are all available over the counter, and you may need to use some, POSSIBLY even all of these options:    Drink plenty of fluids (prune juice may be helpful) and high fiber foods Colace 100 mg by mouth twice a day  Senokot for constipation as directed and as needed Dulcolax (bisacodyl), take with full glass of water  Miralax (polyethylene glycol) once or twice a day as needed.  If you have tried all these things and are unable to have a bowel movement in the first 3-4 days after surgery call either your surgeon or your primary doctor.    If you experience loose stools or diarrhea, hold the medications until you stool forms back up.  If your symptoms do not get better within 1 week or if they get worse, check with your doctor.  If you experience "the worst abdominal pain ever" or develop nausea or vomiting, please contact the office immediately for further recommendations for treatment.   ITCHING:  If you experience itching with your medications, try taking only a single pain pill, or even half a pain pill at a time.  You can also use Benadryl over the counter for itching or also to help with sleep.   TED HOSE STOCKINGS:  Use stockings on both legs until  for at least 2 weeks or as directed by physician office. They may be removed at night for sleeping.  MEDICATIONS:  See your medication summary on the "After Visit Summary" that nursing will review with you.  You may have some home medications which will be placed on hold until you complete the course of blood thinner medication.  It is important for you to complete the blood thinner medication as prescribed.   Blood clot prevention (DVT Prophylaxis): After surgery you are at an increased risk for a blood clot. you were prescribed a blood thinner, Aspirin 81mg , to be taken twice daily for a total of 4 weeks from surgery to help reduce your risk of getting a blood clot. This will help prevent a  blood clot. Signs of a pulmonary embolus (blood clot in the lungs) include sudden short of breath, feeling lightheaded or dizzy, chest pain with a deep breath, rapid pulse rapid breathing. Signs of a blood clot in your arms or legs include new unexplained swelling and cramping, warm, red or darkened skin around the painful area. Please call the office or 911 right away if these signs or symptoms develop.  PRECAUTIONS:  If you experience chest pain or shortness of breath - call 911 immediately for transfer to the hospital emergency department.   If you develop a fever greater that 101 F, purulent drainage from wound, increased redness or drainage from wound, foul odor from the wound/dressing, or calf pain - CONTACT YOUR SURGEON.                                                   FOLLOW-UP APPOINTMENTS:  If you do not already have a post-op appointment, please call the office for an appointment to be seen by your surgeon.  Guidelines for how soon to be seen are listed in your "After Visit Summary", but are typically between 2-3 weeks after surgery.  OTHER INSTRUCTIONS:   Knee Replacement:  Do not place pillow under knee, focus on keeping the knee straight while resting. CPM instructions: 0-90 degrees, 2 hours in the  morning, 2 hours in the afternoon, and 2 hours in the evening. Place foam block, curve side up under heel at all times except when when walking.  DO NOT modify, tear, cut, or change the foam block in any way.  POST-OPERATIVE OPIOID TAPER INSTRUCTIONS: It is important to wean off of your opioid medication as soon as possible. If you do not need pain medication after your surgery it is ok to stop day one. Opioids include: Codeine, Hydrocodone(Norco, Vicodin), Oxycodone(Percocet, oxycontin) and hydromorphone amongst others.  Long term and even short term use of opiods can cause: Increased pain response Dependence Constipation Depression Respiratory depression And more.  Withdrawal symptoms can include Flu like symptoms Nausea, vomiting And more Techniques to manage these symptoms Hydrate well Eat regular healthy meals Stay active Use relaxation techniques(deep breathing, meditating, yoga) Do Not substitute Alcohol to help with tapering If you have been on opioids for less than two weeks and do not have pain than it is ok to stop all together.  Plan to wean off of opioids This plan should start within one week post op of your joint replacement. Maintain the same interval or time between taking each dose and first decrease the dose.  Cut the total daily intake of opioids by one tablet each day Next start to increase the time between doses. The last dose that should be eliminated is the evening dose.   MAKE SURE YOU:  Understand these instructions.  Get help right away if you are not doing well or get worse.    Thank you for letting us be a part of your medical care team.  It is a privilege we respect greatly.  We hope these instructions will help you stay on track for a fast and full recovery!            Signed: Vasiliki Smaldone A Trezure Cronk 12/22/2022, 7:12 AM

## 2022-12-22 NOTE — Care Management Important Message (Signed)
Important Message  Patient Details  Name: Phyllis Leonard MRN: [id number] Date of Birth: [birth year]   Medicare Important Message Given:  Yes     Sherilyn Banker 12/22/2022, 2:51 PM

## 2022-12-22 NOTE — Progress Notes (Signed)
Physical Therapy Treatment Patient Details Name: Phyllis Leonard MRN: [id number] DOB: 03-24-1956 Today's Date: 12/22/2022   History of Present Illness 67 y.o. female admitted 6/7 s/p RIGHT TOTAL KNEE REVISION. PMH: GERD     History of colonic polyps    Gastroesophageal reflux disease  Hx of colonic polyps    PT Comments    Continuing work on functional mobility and activity tolerance;  Session focused on prepping for getting home; Questions solicited and answered; reviewed seated knee flexion exercise; OK for dc home from PT standpoint    Recommendations for follow up therapy are one component of a multi-disciplinary discharge planning process, led by the attending physician.  Recommendations may be updated based on patient status, additional functional criteria and insurance authorization.  Follow Up Recommendations       Assistance Recommended at Discharge Set up Supervision/Assistance  Patient can return home with the following A little help with bathing/dressing/bathroom;Assistance with cooking/housework;Assist for transportation;Help with stairs or ramp for entrance   Equipment Recommendations  None recommended by PT    Recommendations for Other Services       Precautions / Restrictions Precautions Precautions: Knee;Fall Precaution Booklet Issued: Yes (comment) Precaution Comments: Pt educated to not allow any pillow or bolster under knee for healing with optimal range of motion.  Restrictions Weight Bearing Restrictions: Yes RLE Weight Bearing: Weight bearing as tolerated     Mobility  Bed Mobility Overal bed mobility: Needs Assistance Bed Mobility: Supine to Sit     Supine to sit: Supervision     General bed mobility comments: Cues for more weight shift for more efficient reciprocal scooting to square off oatEOB    Transfers Overall transfer level: Needs assistance Equipment used: Rolling walker (2 wheels) Transfers: Sit to/from Stand Sit to Stand: Min guard  (without physical assist)           General transfer comment: cues for hand placement and safety; tends to use momentum initially    Ambulation/Gait Ambulation/Gait assistance: Supervision Gait Distance (Feet): 35 Feet Assistive device: Rolling walker (2 wheels) Gait Pattern/deviations: Step-through pattern, Antalgic Gait velocity: decr     General Gait Details: Nice, stable knee in stance   Stairs         General stair comments: REviewed stairs   Wheelchair Mobility    Modified Rankin (Stroke Patients Only)       Balance Overall balance assessment: Mild deficits observed, not formally tested                                          Cognition Arousal/Alertness: Awake/alert Behavior During Therapy: WFL for tasks assessed/performed Overall Cognitive Status: Within Functional Limits for tasks assessed                                          Exercises Total Joint Exercises Knee Flexion: AAROM, Right, 5 reps, Seated (Used LLE to assist RLE into more flexion)    General Comments General comments (skin integrity, edema, etc.): Assisted with donning underwear and pants      Pertinent Vitals/Pain Pain Assessment Pain Assessment: Faces Faces Pain Scale: Hurts even more Pain Location: Rt knee Pain Descriptors / Indicators: Sore Pain Intervention(s): Monitored during session    Home Living  Prior Function            PT Goals (current goals can now be found in the care plan section) Acute Rehab PT Goals Patient Stated Goal: Get well PT Goal Formulation: With patient Time For Goal Achievement: 12/27/22 Potential to Achieve Goals: Good Progress towards PT goals: Progressing toward goals    Frequency    7X/week      PT Plan Current plan remains appropriate    Co-evaluation              AM-PAC PT "6 Clicks" Mobility   Outcome Measure  Help needed turning from your  back to your side while in a flat bed without using bedrails?: None Help needed moving from lying on your back to sitting on the side of a flat bed without using bedrails?: None Help needed moving to and from a bed to a chair (including a wheelchair)?: A Little Help needed standing up from a chair using your arms (e.g., wheelchair or bedside chair)?: A Little Help needed to walk in hospital room?: A Little Help needed climbing 3-5 steps with a railing? : A Little 6 Click Score: 20    End of Session Equipment Utilized During Treatment: Gait belt Activity Tolerance: Patient tolerated treatment well Patient left: in chair;with call bell/phone within reach (preparing to meet husband at the car) Nurse Communication: Mobility status PT Visit Diagnosis: Other abnormalities of gait and mobility (R26.89);Difficulty in walking, not elsewhere classified (R26.2);Pain Pain - Right/Left: Right Pain - part of body: Knee     Time: 2130-8657 PT Time Calculation (min) (ACUTE ONLY): 22 min  Charges:  $Therapeutic Activity: 8-22 mins                     Van Clines, PT  Acute Rehabilitation Services Office [phone] Secure Chat welcomed    Levi Aland 12/22/2022, 10:52 AM

## 2022-12-22 NOTE — Progress Notes (Signed)
     Subjective:  Patient reports pain as well controlled. Has been doing well with PT. Eager to go home today. Denies distal n/t.  Objective:   VITALS:   Vitals:   12/21/22 0743 12/21/22 1432 12/21/22 2003 12/22/22 0501  BP: (!) 160/70 127/67 (!) 155/71 128/66  Pulse:   96 94  Resp: 17 17 20 16   Temp: 98.4 F (36.9 C) 98.1 F (36.7 C) 98.9 F (37.2 C) 98.5 F (36.9 C)  TempSrc: Oral Oral    SpO2: 95% 96% 97% 94%  Weight:      Height:        Sensation intact distally Intact pulses distally Dorsiflexion/Plantar flexion intact Incision: dressing C/D/I Compartment soft Wound vac holding suction  Lab Results  Component Value Date   WBC 9.8 12/21/2022   HGB 12.2 12/21/2022   HCT 37.5 12/21/2022   MCV 91.2 12/21/2022   PLT 180 12/21/2022   BMET    Component Value Date/Time   NA 136 12/20/2022 0132   K 4.6 12/20/2022 0132   CL 101 12/20/2022 0132   CO2 22 12/20/2022 0132   GLUCOSE 166 (H) 12/20/2022 0132   BUN 25 (H) 12/20/2022 0132   CREATININE 1.20 (H) 12/20/2022 0132   CALCIUM 9.2 12/20/2022 0132   GFRNONAA 50 (L) 12/20/2022 0132    Xray: revision TKA components in good position with no adverse features  Assessment/Plan: 3 Days Post-Op   Principal Problem:   Failed total knee arthroplasty (HCC)  S/p R revision TKA 12/19/22  Post op recs: WB: WBAT Abx: ancef continue in-house, follow-up IntraOp cultures, discharged with cefadroxil 500 twice daily x 7 days Imaging: PACU xrays DVT prophylaxis: Aspirin 81mg  BID x4 weeks Follow up: 2 weeks after surgery for a wound check with Dr. Blanchie Dessert at [facility].  Address: [address], [place], [place] [address]  Office Phone: [phone]      Joen Laura 12/22/2022, 7:10 AM   Weber Cooks, MD  Contact information:   [phone] 7am-5pm epic message Dr. Blanchie Dessert, or call office for patient follow up: [phone] After hours and holidays please check  [email or web address] for group call information for [facility]

## 2022-12-22 NOTE — Progress Notes (Signed)
Phyllis Leonard to be D/C'd Home per MD order.  Discussed with the patient and all questions fully answered.  VSS, Skin clean, dry and intact without evidence of skin break down, no evidence of skin tears noted. IV catheter discontinued intact. Site without signs and symptoms of complications. Dressing and pressure applied. Preveena wound wac in place, running with no issues.   An After Visit Summary was printed and given to the patient. Patient prescriptions sent to pharamacy.  D/c education completed with patient/family including follow up instructions, medication list, d/c activities limitations if indicated, with other d/c instructions as indicated by MD - patient able to verbalize understanding, all questions fully answered.   Patient instructed to return to ED, call 911, or call MD for any changes in condition.   Patient escorted via WC, and D/C home via private auto.  Phyllis Leonard 12/22/2022 10:55 AM

## 2022-12-22 NOTE — Progress Notes (Signed)
Discharge instructions given. Patient verbalized understanding and all questions were answered.  ?

## 2022-12-23 LAB — AEROBIC/ANAEROBIC CULTURE W GRAM STAIN (SURGICAL/DEEP WOUND): Gram Stain: NONE SEEN

## 2022-12-24 LAB — AEROBIC/ANAEROBIC CULTURE W GRAM STAIN (SURGICAL/DEEP WOUND)

## 2022-12-25 ENCOUNTER — Telehealth (HOSPITAL_COMMUNITY): Payer: Self-pay | Admitting: Orthopedic Surgery

## 2022-12-25 MED ORDER — CEFADROXIL 500 MG PO CAPS: 500.0000 mg | ORAL_CAPSULE | Freq: Two times a day (BID) | ORAL | 0 refills | Status: AC

## 2022-12-25 NOTE — Telephone Encounter (Signed)
Called in cefadroxil for extended surgical prophylaxis. Referral to ID made.

## 2023-01-14 ENCOUNTER — Telehealth: Payer: Self-pay

## 2023-01-14 ENCOUNTER — Encounter: Payer: Self-pay | Admitting: Internal Medicine

## 2023-01-14 ENCOUNTER — Ambulatory Visit (INDEPENDENT_AMBULATORY_CARE_PROVIDER_SITE_OTHER): Payer: Medicare Other | Admitting: Internal Medicine

## 2023-01-14 ENCOUNTER — Other Ambulatory Visit: Payer: Self-pay

## 2023-01-14 ENCOUNTER — Other Ambulatory Visit (HOSPITAL_COMMUNITY): Payer: Self-pay | Admitting: *Deleted

## 2023-01-14 VITALS — BP 127/79 | HR 82 | Temp 98.2°F | Ht 64.0 in | Wt 219.0 lb

## 2023-01-14 DIAGNOSIS — T8453XA Infection and inflammatory reaction due to internal right knee prosthesis, initial encounter: Secondary | ICD-10-CM

## 2023-01-14 LAB — CBC WITH DIFFERENTIAL/PLATELET
Eosinophils Relative: 4 %
Hemoglobin: 12.3 g/dL (ref 11.7–15.5)
MCH: 29.9 pg (ref 27.0–33.0)
MCV: 90.8 fL (ref 80.0–100.0)
Neutro Abs: 4859 cells/uL (ref 1500–7800)
WBC: 7.7 10*3/uL (ref 3.8–10.8)

## 2023-01-14 NOTE — Telephone Encounter (Signed)
New OPAT orders per Dr. Drue Second, orders shared with Jeri Modena, RN at [facility] pharmacy staff.   IR appointment: 01/16/23 @ 12pm - appointment time and location provided to both patient and [employer].   First dose: short stay - Advanced aware and orders faxed to short stay

## 2023-01-14 NOTE — Progress Notes (Signed)
RFV: initial visit for right knee pji Patient ID: Phyllis Leonard, female   DOB: [birth year], 67 y.o.   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 She underwent right knee TKA roughly 18 years ago. 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 She reports having deconditioning associated with right knee pain. She underwent right kne revision with new implant in mid June, she was placed on cefadroxil for post op prophylaxis until cultures were held for 14 days. Cultures from the OR ( x 2 ) - one of which grew rare c.acnes. she has had good recovery since the surgery. Pain is much improved. No warmth, incision healing well. She isn't sure how she may have had this infection. She is taken back by the treatment plan/iv abtx since she felt that she is doing so much better since this surgery.    Outpatient Encounter Medications as of 01/14/2023  Medication Sig   acetaminophen (TYLENOL) 650 MG CR tablet Take 1,300 mg by mouth every 8 (eight) hours as needed for pain.   ARTIFICIAL TEAR SOLUTION OP Place 1 drop into both eyes daily as needed (dry eyes).   aspirin EC 81 MG tablet Take 1 tablet (81 mg total) by mouth 2 (two) times daily for 28 days. Swallow whole.   atorvastatin (LIPITOR) 80 MG tablet Take 80 mg by mouth every evening.    cefadroxil (DURICEF) 500 MG capsule Take 500 mg by mouth 2 (two) times daily.   DULoxetine (CYMBALTA) 60 MG capsule Take 60 mg by mouth every evening.    FIASP FLEXTOUCH 100 UNIT/ML FlexTouch Pen Inject 26-30 Units into the skin See admin instructions. Inject 26 units in the morning and 30 units in the evening with food   Insulin Glargine, 2 Unit Dial, (TOUJEO MAX SOLOSTAR) 300 UNIT/ML SOPN Inject 56 Units into the skin in the morning and at bedtime.   losartan (COZAAR) 50 MG tablet Take 50 mg by mouth daily.   meloxicam (MOBIC) 15 MG tablet Take 15 mg by mouth daily.   omeprazole (PRILOSEC) 40 MG capsule Take 40 mg by mouth  daily.   oxymetazoline (AFRIN) 0.05 % nasal spray Place 1 spray into both nostrils 2 (two) times daily.   OZEMPIC, 1 MG/DOSE, 4 MG/3ML SOPN Inject 1 mg into the skin every 7 (seven) days. Saturday   pioglitazone (ACTOS) 15 MG tablet Take 15 mg by mouth daily.   Vitamin D, Ergocalciferol, (DRISDOL) 1.25 MG (50000 UT) CAPS capsule Take 50,000 Units by mouth every Saturday.   acetaminophen (TYLENOL) 500 MG tablet Take 2 tablets (1,000 mg total) by mouth every 8 (eight) hours as needed. (Patient not taking: Reported on 01/14/2023)   famotidine (PEPCID) 20 MG tablet Take 1 tablet (20 mg total) by mouth at bedtime as needed for heartburn or indigestion. (Patient not taking: Reported on 12/01/2022)   No facility-administered encounter medications on file as of 01/14/2023.     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   GERD (gastroesophageal reflux disease) 02/02/2019   History of colonic polyps 02/02/2019   Gastroesophageal reflux disease 02/02/2019   Hx of colonic polyps 02/02/2019     Health Maintenance Due  Topic Date Due   Medicare Annual Wellness (AWV)  Never done   Diabetic kidney evaluation - Urine ACR  Never done   Hepatitis C Screening  Never done  DTaP/Tdap/Td (1 - Tdap) Never done   Zoster Vaccines- Shingrix (1 of 2) Never done   MAMMOGRAM  Never done   Pneumonia Vaccine 27+ Years old (1 of 1 - PCV) Never done   DEXA SCAN  Never done   COVID-19 Vaccine (4 - 2023-24 season) 03/14/2022     Review of Systems  Constitutional: Negative for fever, chills, diaphoresis, activity change, appetite change, fatigue and unexpected weight change.  HENT: Negative for congestion, sore throat, rhinorrhea, sneezing, trouble swallowing and sinus pressure.  Eyes: Negative for photophobia and visual disturbance.  Respiratory: Negative for cough, chest tightness, shortness of breath, wheezing and stridor.  Cardiovascular: Negative for  chest pain, palpitations and leg swelling.  Gastrointestinal: Negative for nausea, vomiting, abdominal pain, diarrhea, constipation, blood in stool, abdominal distention and anal bleeding.  Genitourinary: Negative for dysuria, hematuria, flank pain and difficulty urinating.  Musculoskeletal: Negative for myalgias, back pain, joint swelling, arthralgias and gait problem.  Skin: Negative for color change, pallor, rash and wound.  Neurological: Negative for dizziness, tremors, weakness and light-headedness.  Hematological: Negative for adenopathy. Does not bruise/bleed easily.  Psychiatric/Behavioral: Negative for behavioral problems, confusion, sleep disturbance, dysphoric mood, decreased concentration and agitation.   Physical Exam   BP 127/79   Pulse 82   Temp 98.2 F (36.8 C) (Temporal)   Ht 5\' 4"  (1.626 m)   Wt 219 lb (99.3 kg)   SpO2 99%   BMI 37.59 kg/m   Physical Exam  Constitutional:  oriented to person, place, and time. appears well-developed and well-nourished. No distress.  HENT: [place]/AT, PERRLA, no scleral icterus Mouth/Throat: Oropharynx is clear and moist. No oropharyngeal exudate.  Cardiovascular: Normal rate, regular rhythm and normal heart sounds. Exam reveals no gallop and no friction rub.  No murmur heard.  Pulmonary/Chest: Effort normal and breath sounds normal. No respiratory distress.  has no wheezes.  Neck = supple, no nuchal rigidity Abdominal: Soft. Bowel sounds are normal.  exhibits no distension. There is no tenderness.  Lymphadenopathy: no cervical adenopathy. No axillary adenopathy Neurological: alert and oriented to person, place, and time.  Skin: Skin is warm and dry. No rash noted. No erythema.  Psychiatric: a normal mood and affect.  behavior is normal.    CBC Lab Results  Component Value Date   WBC 9.0 12/22/2022   RBC 3.71 (L) 12/22/2022   HGB 11.2 (L) 12/22/2022   HCT 33.8 (L) 12/22/2022   PLT 155 12/22/2022   MCV 91.1 12/22/2022   MCH 30.2  12/22/2022   MCHC 33.1 12/22/2022   RDW 13.7 12/22/2022   LYMPHSABS 1.6 12/02/2022   MONOABS 0.6 12/02/2022   EOSABS 0.4 12/02/2022    BMET Lab Results  Component Value Date   NA 133 (L) 12/22/2022   K 4.1 12/22/2022   CL 97 (L) 12/22/2022   CO2 24 12/22/2022   GLUCOSE 128 (H) 12/22/2022   BUN 15 12/22/2022   CREATININE 0.96 12/22/2022   CALCIUM 8.8 (L) 12/22/2022   GFRNONAA >60 12/22/2022   GFRAA >90 04/28/2012   Lab Results  Component Value Date   ESRSEDRATE 11 01/14/2023   Lab Results  Component Value Date   CRP <3.0 01/14/2023      Assessment and Plan 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 plan to treat as if it is is true infection since it could contaminant vs. Smoldering infection. We will  Arrange for iv abtx,  Picc line placement on 7/5 Labs today as part of baseline  Diagnosis: pji  Culture Result: propiniobacterium acnes  No Known Allergies  OPAT Orders Discharge antibiotics to be given via PICC line Discharge antibiotics: Per pharmacy protocol: ceftriaxone 2gm IV daily  Duration: 6 wk End Date: August 16th 2024  [facility] Care Per Protocol:  Home health RN for IV administration and teaching; PICC line care and labs.    Labs weekly while on IV antibiotics: _x_ CBC with differential x__ BMP _x_ CRP _x_ ESR   _x_ Please pull PIC at completion of IV antibiotics  Fax weekly labs to [phone]  Clinic Follow Up Appt: In 6 wk  @  I have personally spent 50 minutes involved in face-to-face and non-face-to-face activities for this patient on the day of the visit. Professional time spent includes the following activities: Preparing to see the patient (review of tests), Obtaining and/or reviewing separately obtained history (admission/discharge record), Performing a medically appropriate examination and/or evaluation , Ordering medications/tests/procedures, referring and communicating with other health care professionals, Documenting  clinical information in the EMR, Independently interpreting results (not separately reported), Communicating results to the patient/family/caregiver, Counseling and educating the patient/family/caregiver and Care coordination (not separately reported).

## 2023-01-14 NOTE — Telephone Encounter (Signed)
Left vm with short stay to confirm orders. [name], RMA

## 2023-01-15 LAB — SEDIMENTATION RATE: Sed Rate: 11 mm/h (ref 0–30)

## 2023-01-15 LAB — BASIC METABOLIC PANEL
BUN/Creatinine Ratio: 25 (calc) — ABNORMAL HIGH (ref 6–22)
BUN: 28 mg/dL — ABNORMAL HIGH (ref 7–25)
CO2: 25 mmol/L (ref 20–32)
Calcium: 9.9 mg/dL (ref 8.6–10.4)
Chloride: 105 mmol/L (ref 98–110)
Creat: 1.13 mg/dL — ABNORMAL HIGH (ref 0.50–1.05)
Glucose, Bld: 60 mg/dL — ABNORMAL LOW (ref 65–99)
Potassium: 4.5 mmol/L (ref 3.5–5.3)
Sodium: 140 mmol/L (ref 135–146)

## 2023-01-15 LAB — CBC WITH DIFFERENTIAL/PLATELET
Absolute Monocytes: 662 cells/uL (ref 200–950)
Basophils Absolute: 54 cells/uL (ref 0–200)
Basophils Relative: 0.7 %
Eosinophils Absolute: 308 cells/uL (ref 15–500)
HCT: 37.3 % (ref 35.0–45.0)
Lymphs Abs: 1817 cells/uL (ref 850–3900)
MCHC: 33 g/dL (ref 32.0–36.0)
MPV: 11.6 fL (ref 7.5–12.5)
Monocytes Relative: 8.6 %
Neutrophils Relative %: 63.1 %
Platelets: 240 10*3/uL (ref 140–400)
RBC: 4.11 10*6/uL (ref 3.80–5.10)
RDW: 14.2 % (ref 11.0–15.0)
Total Lymphocyte: 23.6 %

## 2023-01-15 LAB — C-REACTIVE PROTEIN: CRP: <3 mg/L (ref <8.0)

## 2023-01-16 ENCOUNTER — Encounter (HOSPITAL_COMMUNITY)
Admission: RE | Admit: 2023-01-16 | Discharge: 2023-01-16 | Disposition: A | Discharge status: Home / Self Care | Payer: Medicare Other | Source: Ambulatory Visit | Attending: Internal Medicine | Admitting: Internal Medicine

## 2023-01-16 ENCOUNTER — Ambulatory Visit (HOSPITAL_COMMUNITY)
Admission: RE | Admit: 2023-01-16 | Discharge: 2023-01-16 | Disposition: A | Discharge status: Home / Self Care | Payer: Medicare Other | Source: Ambulatory Visit | Attending: Internal Medicine | Admitting: Internal Medicine

## 2023-01-16 DIAGNOSIS — Y839 Surgical procedure, unspecified as the cause of abnormal reaction of the patient, or of later complication, without mention of misadventure at the time of the procedure: Secondary | ICD-10-CM | POA: Diagnosis not present

## 2023-01-16 DIAGNOSIS — X58XXXA Exposure to other specified factors, initial encounter: Secondary | ICD-10-CM | POA: Insufficient documentation

## 2023-01-16 DIAGNOSIS — T8453XA Infection and inflammatory reaction due to internal right knee prosthesis, initial encounter: Secondary | ICD-10-CM | POA: Diagnosis present

## 2023-01-16 MED ORDER — LIDOCAINE HCL 1 % IJ SOLN
INTRAMUSCULAR | Status: AC
  Filled 2023-01-16: qty 20

## 2023-01-16 MED ORDER — SODIUM CHLORIDE 0.9 % IV SOLN
2.0000 g | Freq: Once | INTRAVENOUS | Status: AC
  Administered 2023-01-16: 2 g via INTRAVENOUS
  Filled 2023-01-16: qty 20

## 2023-01-16 MED ORDER — HEPARIN SOD (PORK) LOCK FLUSH 100 UNIT/ML IV SOLN: 250.0000 [IU] | INTRAVENOUS | Status: AC | PRN

## 2023-01-16 MED ORDER — HEPARIN SOD (PORK) LOCK FLUSH 100 UNIT/ML IV SOLN
INTRAVENOUS | Status: AC
  Administered 2023-01-16: 250 [IU]
  Filled 2023-01-16: qty 5

## 2023-01-23 ENCOUNTER — Telehealth: Payer: Self-pay

## 2023-01-23 NOTE — Telephone Encounter (Signed)
Spoke with Jeri Modena with [employer] regarding pt. States that if RN is not able to get labs today RN will go out Monday.  Patient is scheduled for virtual appt on 7/16. Not able to do labs that day unless patient comes into clinic. [facility] will update patient regarding labs.  [name], RMA

## 2023-01-23 NOTE — Telephone Encounter (Signed)
Spoke with patient who states that she is not having any issues administering antibiotics. Was told that another home health nurse will come out later today and try to get blood work done.  Spoke with Amy at [facility] and informed them that if not able to get blood work done today that office can try at appointment.  [name], RMA

## 2023-01-23 NOTE — Telephone Encounter (Signed)
Received call from home health nurse stating that they were not able to get blood for labs. Tried peripheral stick twice and was unsuccessful.  Will forward message to provider. [name], RMA

## 2023-01-23 NOTE — Telephone Encounter (Signed)
No issues with administering ceftriaxone though right? She has an appointment with Dr. Drue Second on 7/16 - we could wait to get labs at that appointment. Phyllis Leonard

## 2023-01-27 ENCOUNTER — Telehealth (INDEPENDENT_AMBULATORY_CARE_PROVIDER_SITE_OTHER): Payer: Medicare Other | Admitting: Internal Medicine

## 2023-01-27 ENCOUNTER — Other Ambulatory Visit: Payer: Self-pay

## 2023-01-27 DIAGNOSIS — T8453XD Infection and inflammatory reaction due to internal right knee prosthesis, subsequent encounter: Secondary | ICD-10-CM | POA: Diagnosis not present

## 2023-01-27 DIAGNOSIS — T8453XA Infection and inflammatory reaction due to internal right knee prosthesis, initial encounter: Secondary | ICD-10-CM

## 2023-01-27 NOTE — Progress Notes (Signed)
Virtual Visit via Video Note  I connected with SHANDORA LARSEN on 01/27/23 at  2:00 PM EDT by a video enabled telemedicine application and verified that I am speaking with the correct person using two identifiers.  Location: Patient: at home Provider: at clinic   I discussed the limitations of evaluation and management by telemedicine and the availability of in person appointments. The patient expressed understanding and agreed to proceed.  History of Present Illness: Right knee pji with c.acnes -- she is tolerating ceftriaxone without difficulty   Observations/Objective: A x O by 3 in NAD Ext: PICC line is c/d/i  Assessment and Plan: Continue with course and finish out 6 wk then we will change to oral abtx to complete 6 wk  Follow Up Instructions: if any issues with picc line and abtx administration, to contact home health. We will see back at end of 6 wk of iv abtx to transition to oral abtx    I discussed the assessment and treatment plan with the patient. The patient was provided an opportunity to ask questions and all were answered. The patient agreed with the plan and demonstrated an understanding of the instructions.   The patient was advised to call back or seek an in-person evaluation if the symptoms worsen or if the condition fails to improve as anticipated.  I provided 15 minutes of non-face-to-face time during this encounter.   Judyann Munson, MD

## 2023-02-05 ENCOUNTER — Ambulatory Visit: Payer: Medicare Other | Admitting: Infectious Diseases

## 2023-02-25 ENCOUNTER — Ambulatory Visit: Payer: Medicare Other | Admitting: Internal Medicine

## 2023-02-26 ENCOUNTER — Telehealth: Payer: Self-pay

## 2023-02-26 ENCOUNTER — Ambulatory Visit (INDEPENDENT_AMBULATORY_CARE_PROVIDER_SITE_OTHER): Payer: Medicare Other | Admitting: Internal Medicine

## 2023-02-26 ENCOUNTER — Other Ambulatory Visit: Payer: Self-pay

## 2023-02-26 ENCOUNTER — Encounter: Payer: Self-pay | Admitting: Internal Medicine

## 2023-02-26 VITALS — BP 124/78 | HR 72 | Temp 97.6°F | Wt 222.0 lb

## 2023-02-26 DIAGNOSIS — T8453XD Infection and inflammatory reaction due to internal right knee prosthesis, subsequent encounter: Secondary | ICD-10-CM

## 2023-02-26 DIAGNOSIS — T8450XS Infection and inflammatory reaction due to unspecified internal joint prosthesis, sequela: Secondary | ICD-10-CM

## 2023-02-26 MED ORDER — FLUCONAZOLE 150 MG PO TABS: 150.0000 mg | ORAL_TABLET | Freq: Once | ORAL | 1 refills | Status: AC | PRN

## 2023-02-26 MED ORDER — CEFADROXIL 500 MG PO CAPS: 1000.0000 mg | ORAL_CAPSULE | Freq: Two times a day (BID) | ORAL | 3 refills | Status: DC

## 2023-02-26 NOTE — Telephone Encounter (Signed)
Per Dr. Drue Second okay for [facility] to pull picc. Community message sent to [employer] [facility] team with orders. Patient aware of plan.  [name], RMA

## 2023-02-26 NOTE — Progress Notes (Signed)
ZOX:WRUEA knee pji with c.acnes Patient ID: Phyllis Leonard, female   DOB: July 02, 1956, 67 y.o.   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 She underwent right knee TKA roughly 18 years ago. 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 She reports having deconditioning associated with right knee pain. She underwent right kne revision with new implant in mid June, she was placed on cefadroxil for post op prophylaxis until cultures were held for 14 days. Cultures from the OR ( x 2 ) - one of which grew rare c.acnes. Pain is much improved. No warmth, incision healing well. She isn't sure how she may have had this infection. She Has been on iv abtx for 6 wk which ends 8/16 Mid shin - in the past 10 days --new but knee isnt having much pain.    Outpatient Encounter Medications as of 02/26/2023  Medication Sig   acetaminophen (TYLENOL) 650 MG CR tablet Take 1,300 mg by mouth every 8 (eight) hours as needed for pain.   ARTIFICIAL TEAR SOLUTION OP Place 1 drop into both eyes daily as needed (dry eyes).   atorvastatin (LIPITOR) 80 MG tablet Take 80 mg by mouth every evening.    cefadroxil (DURICEF) 500 MG capsule Take 500 mg by mouth 2 (two) times daily.   cefTRIAXone (ROCEPHIN) IVPB Inject 2 g into the vein daily.   DULoxetine (CYMBALTA) 60 MG capsule Take 60 mg by mouth every evening.    famotidine (PEPCID) 20 MG tablet Take 1 tablet (20 mg total) by mouth at bedtime as needed for heartburn or indigestion.   FIASP FLEXTOUCH 100 UNIT/ML FlexTouch Pen Inject 26-30 Units into the skin See admin instructions. Inject 26 units in the morning and 30 units in the evening with food   Insulin Glargine, 2 Unit Dial, (TOUJEO MAX SOLOSTAR) 300 UNIT/ML SOPN Inject 56 Units into the skin in the morning and at bedtime.   losartan (COZAAR) 50 MG tablet Take 50 mg by mouth daily.   meloxicam (MOBIC) 15 MG tablet Take 15 mg by mouth daily.   omeprazole (PRILOSEC) 40 MG  capsule Take 40 mg by mouth daily.   oxymetazoline (AFRIN) 0.05 % nasal spray Place 1 spray into both nostrils 2 (two) times daily.   OZEMPIC, 1 MG/DOSE, 4 MG/3ML SOPN Inject 1 mg into the skin every 7 (seven) days. Saturday   pioglitazone (ACTOS) 15 MG tablet Take 15 mg by mouth daily.   Vitamin D, Ergocalciferol, (DRISDOL) 1.25 MG (50000 UT) CAPS capsule Take 50,000 Units by mouth every Saturday.   No facility-administered encounter medications on file as of 02/26/2023.     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   GERD (gastroesophageal reflux disease) 02/02/2019   History of colonic polyps 02/02/2019   Gastroesophageal reflux disease 02/02/2019   Hx of colonic polyps 02/02/2019     Health Maintenance Due  Topic Date Due   Medicare Annual Wellness (AWV)  Never done   Diabetic kidney evaluation - Urine ACR  Never done   Hepatitis C Screening  Never done   DTaP/Tdap/Td (1 - Tdap) Never done   Zoster Vaccines- Shingrix (1 of 2) 06/30/1975   MAMMOGRAM  Never done   Pneumonia Vaccine 24+ Years old (1 of 1 - PCV) Never done   DEXA SCAN  Never done   COVID-19 Vaccine (4 - 2023-24 season)  03/14/2022   INFLUENZA VACCINE  02/12/2023     Review of Systems 12 point ros is negative except what is mentioned above Physical Exam   BP 124/78   Pulse 72   Temp 97.6 F (36.4 C) (Oral)   Wt 222 lb (100.7 kg)   SpO2 96%   BMI 38.11 kg/m    Physical Exam  Constitutional:  oriented to person, place, and time. appears well-developed and well-nourished. No distress.  HENT: [place]/AT, PERRLA, no scleral icterus Mouth/Throat: Oropharynx is clear and moist. No oropharyngeal exudate.  Ext: right arm picc line is c/d/I Ext: mid shin tender to palpation no erythema no warmth Skin: Skin is warm and dry. No rash noted. No erythema. Incision well healed Psychiatric: a normal mood and affect.  behavior is normal.    CBC Lab Results   Component Value Date   WBC 7.7 01/14/2023   RBC 4.11 01/14/2023   HGB 12.3 01/14/2023   HCT 37.3 01/14/2023   PLT 240 01/14/2023   MCV 90.8 01/14/2023   MCH 29.9 01/14/2023   MCHC 33.0 01/14/2023   RDW 14.2 01/14/2023   LYMPHSABS 1,817 01/14/2023   MONOABS 0.6 12/02/2022   EOSABS 308 01/14/2023    BMET Lab Results  Component Value Date   NA 140 01/14/2023   K 4.5 01/14/2023   CL 105 01/14/2023   CO2 25 01/14/2023   GLUCOSE 60 (L) 01/14/2023   BUN 28 (H) 01/14/2023   CREATININE 1.13 (H) 01/14/2023   CALCIUM 9.9 01/14/2023   GFRNONAA >60 12/22/2022   GFRAA >90 04/28/2012      Assessment and Plan C.acnes pji of right knee = has just completed 6 wk of iv ceftriaxone 2gm iv daily and now we will  Switch to cefadroxil 1000mg  po bid. Will see back in 6 wks. Plan for a total of 6 months of treatment for knee pji  Will send fluconazole 150mg  if needed for vaginal candidiasis

## 2023-04-20 ENCOUNTER — Telehealth: Payer: Self-pay

## 2023-04-20 NOTE — Telephone Encounter (Signed)
Per Dr Drue Second cefadroxil will be okay. Will need to take an hour before procedure.  [name], RMA

## 2023-04-20 NOTE — Telephone Encounter (Signed)
Patient called for clarification. Would like to to know if she should take Cefadroxil 2 tabs or 4 tabs before dental procedure. Message forwarded to Dr. Drue Second.  Waiting on provider. Will update patient once MD responds. [name], RMA

## 2023-04-20 NOTE — Telephone Encounter (Signed)
Patient called office today requesting to speak with Dr. Drue Second. States that patient is scheduled for dental appt on 10/28. Patient would like to know if her current antibiotic Cefadroxil will be enough coverage or if she should get prescription for Keflex.  Secure chat forwarded to Dr. Drue Second.  Will call patient back once provider responds Phyllis Leonard, RMA

## 2023-04-21 NOTE — Telephone Encounter (Signed)
Per Dr. Drue Second patient can take 2 tabs of cefadroxil one hour before procedure. Left voicemail with this information.  [name], RMA

## 2023-04-22 ENCOUNTER — Ambulatory Visit: Payer: Medicare Other | Admitting: Internal Medicine

## 2023-05-11 ENCOUNTER — Other Ambulatory Visit: Payer: Self-pay

## 2023-05-11 ENCOUNTER — Ambulatory Visit: Payer: Medicare Other | Admitting: Internal Medicine

## 2023-05-11 VITALS — BP 140/76 | HR 79 | Temp 97.7°F | Wt 223.0 lb

## 2023-05-11 DIAGNOSIS — T8453XD Infection and inflammatory reaction due to internal right knee prosthesis, subsequent encounter: Secondary | ICD-10-CM

## 2023-05-11 DIAGNOSIS — T8450XS Infection and inflammatory reaction due to unspecified internal joint prosthesis, sequela: Secondary | ICD-10-CM

## 2023-05-11 NOTE — Progress Notes (Signed)
Patient ID: Phyllis Leonard, female   DOB: 1955/12/15, 67 y.o.   MRN: [id number]  HPI Right knee pji with c.acnes. cefadroxil through early January. Had dental procedure without difficulty. She is  Tolerating cefadroxil 1000mg  po bid  Outpatient Encounter Medications as of 05/11/2023  Medication Sig   acetaminophen (TYLENOL) 650 MG CR tablet Take 1,300 mg by mouth every 8 (eight) hours as needed for pain.   ARTIFICIAL TEAR SOLUTION OP Place 1 drop into both eyes daily as needed (dry eyes).   atorvastatin (LIPITOR) 80 MG tablet Take 80 mg by mouth every evening.    cefadroxil (DURICEF) 500 MG capsule Take 500 mg by mouth 2 (two) times daily.   cefadroxil (DURICEF) 500 MG capsule Take 2 capsules (1,000 mg total) by mouth 2 (two) times daily.   DULoxetine (CYMBALTA) 60 MG capsule Take 60 mg by mouth every evening.    famotidine (PEPCID) 20 MG tablet Take 1 tablet (20 mg total) by mouth at bedtime as needed for heartburn or indigestion.   FIASP FLEXTOUCH 100 UNIT/ML FlexTouch Pen Inject 26-30 Units into the skin See admin instructions. Inject 26 units in the morning and 30 units in the evening with food   fluconazole (DIFLUCAN) 150 MG tablet Take 1 tablet (150 mg total) by mouth once as needed for up to 1 dose. Can repeat in 4 days if needed for yeast infection   Insulin Glargine, 2 Unit Dial, (TOUJEO MAX SOLOSTAR) 300 UNIT/ML SOPN Inject 56 Units into the skin in the morning and at bedtime.   losartan (COZAAR) 50 MG tablet Take 50 mg by mouth daily.   meloxicam (MOBIC) 15 MG tablet Take 15 mg by mouth daily.   omeprazole (PRILOSEC) 40 MG capsule Take 40 mg by mouth daily.   oxymetazoline (AFRIN) 0.05 % nasal spray Place 1 spray into both nostrils 2 (two) times daily.   OZEMPIC, 1 MG/DOSE, 4 MG/3ML SOPN Inject 1 mg into the skin every 7 (seven) days. Saturday   pioglitazone (ACTOS) 15 MG tablet Take 15 mg by mouth daily.   Vitamin D, Ergocalciferol, (DRISDOL) 1.25 MG (50000 UT) CAPS capsule  Take 50,000 Units by mouth every Saturday.   No facility-administered encounter medications on file as of 05/11/2023.     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   GERD (gastroesophageal reflux disease) 02/02/2019   History of colonic polyps 02/02/2019   Gastroesophageal reflux disease 02/02/2019   Hx of colonic polyps 02/02/2019     Health Maintenance Due  Topic Date Due   Medicare Annual Wellness (AWV)  Never done   Diabetic kidney evaluation - Urine ACR  Never done   Hepatitis C Screening  Never done   DTaP/Tdap/Td (1 - Tdap) Never done   Zoster Vaccines- Shingrix (1 of 2) 06/30/1975   MAMMOGRAM  Never done   Pneumonia Vaccine 70+ Years old (1 of 1 - PCV) Never done   DEXA SCAN  Never done   COVID-19 Vaccine (4 - 2023-24 season) 03/15/2023     Review of Systems 12 point ros is negative Physical Exam   BP (!) 140/76   Pulse 79   Temp 97.7 F (36.5 C) (Oral)   Wt 223 lb (101.2 kg)   SpO2 97%   BMI 38.28 kg/m    Physical Exam  Constitutional:  oriented to person, place, and time. appears well-developed and well-nourished. No distress.  HENT: [place]/AT, PERRLA, no scleral  icterus Mouth/Throat: Oropharynx is clear and moist. No oropharyngeal exudate.  Ext: knee slightly swollen as sequelae of surgery but overall no warmth nor erythema Neurological: alert and oriented to person, place, and time.  Skin: Skin is warm and dry. No rash noted. No erythema.  Psychiatric: a normal mood and affect.  behavior is normal.    CBC Lab Results  Component Value Date   WBC 7.7 01/14/2023   RBC 4.11 01/14/2023   HGB 12.3 01/14/2023   HCT 37.3 01/14/2023   PLT 240 01/14/2023   MCV 90.8 01/14/2023   MCH 29.9 01/14/2023   MCHC 33.0 01/14/2023   RDW 14.2 01/14/2023   LYMPHSABS 1,817 01/14/2023   MONOABS 0.6 12/02/2022   EOSABS 308 01/14/2023    BMET Lab Results  Component Value Date   NA 140 01/14/2023   K 4.5  01/14/2023   CL 105 01/14/2023   CO2 25 01/14/2023   GLUCOSE 60 (L) 01/14/2023   BUN 28 (H) 01/14/2023   CREATININE 1.13 (H) 01/14/2023   CALCIUM 9.9 01/14/2023   GFRNONAA >60 12/22/2022   GFRAA >90 04/28/2012    Lab Results  Component Value Date   ESRSEDRATE 9 05/11/2023   Lab Results  Component Value Date   CRP <3.0 05/11/2023     Assessment and Plan  C.acnes right knee pji = continue on cefadroxil through early January to complete 6 months. Will check inflammatory markers   I have personally spent 20 minutes involved in face-to-face and non-face-to-face activities for this patient on the day of the visit. Professional time spent includes the following activities: Preparing to see the patient (review of tests), Obtaining and/or reviewing separately obtained history (admission/discharge record), Performing a medically appropriate examination and/or evaluation , Ordering medications/tests/procedures, referring and communicating with other health care professionals, Documenting clinical information in the EMR, Independently interpreting results (not separately reported), Communicating results to the patient/family/caregiver, Counseling and educating the patient/family/caregiver and Care coordination (not separately reported).

## 2023-05-12 LAB — C-REACTIVE PROTEIN: CRP: <3 mg/L (ref <8.0)

## 2023-05-12 LAB — SEDIMENTATION RATE: Sed Rate: 9 mm/h (ref 0–30)

## 2023-05-29 ENCOUNTER — Other Ambulatory Visit: Payer: Self-pay | Admitting: Internal Medicine

## 2023-07-20 ENCOUNTER — Telehealth (INDEPENDENT_AMBULATORY_CARE_PROVIDER_SITE_OTHER): Payer: Medicare Other | Admitting: Internal Medicine

## 2023-07-20 DIAGNOSIS — T8453XD Infection and inflammatory reaction due to internal right knee prosthesis, subsequent encounter: Secondary | ICD-10-CM | POA: Diagnosis present

## 2023-07-20 DIAGNOSIS — T8450XS Infection and inflammatory reaction due to unspecified internal joint prosthesis, sequela: Secondary | ICD-10-CM

## 2023-08-16 NOTE — Progress Notes (Signed)
Virtual Visit via Video Note  I connected with Phyllis Leonard on 07/20/2023 at  9:30 AM EST by a video enabled telemedicine application and verified that I am speaking with the correct person using two identifiers.  Location: Patient: at home Provider: at clinic   I discussed the limitations of evaluation and management by telemedicine and the availability of in person appointments. The patient expressed understanding and agreed to proceed.  History of Present Illness: Phyllis Leonard is finishing up 6 month course of abtx for right knee c.acnes pji infection    Observations/Objective:afebrile, doing well overall   Assessment and Plan: - has completed 6 month course of suppressive therapy. No further abtx needed  Follow Up Instructions:   - if has right knee pain, fever, erythema to recur to contact her orthopedic surgeon or we would see her back as well.  I discussed the assessment and treatment plan with the patient. The patient was provided an opportunity to ask questions and all were answered. The patient agreed with the plan and demonstrated an understanding of the instructions.   The patient was advised to call back or seek an in-person evaluation if the symptoms worsen or if the condition fails to improve as anticipated.  I provided 10 minutes of non-face-to-face time during this encounter.   Judyann Munson, MD

## 2023-12-14 ENCOUNTER — Other Ambulatory Visit: Payer: Self-pay | Admitting: Internal Medicine

## 2023-12-15 NOTE — Telephone Encounter (Signed)
Patient is no longer under prescribers care.
